# Patient Record
Sex: Male | Born: 1945
Health system: Southern US, Community
[De-identification: ages and names within clinical notes are randomized; demographics above are authoritative.]

## PROBLEM LIST (undated history)

## (undated) DIAGNOSIS — C4490 Unspecified malignant neoplasm of skin, unspecified: Secondary | ICD-10-CM

## (undated) DIAGNOSIS — R7303 Prediabetes: Secondary | ICD-10-CM

## (undated) DIAGNOSIS — R7309 Other abnormal glucose: Secondary | ICD-10-CM

## (undated) DIAGNOSIS — I10 Essential (primary) hypertension: Secondary | ICD-10-CM

## (undated) DIAGNOSIS — K219 Gastro-esophageal reflux disease without esophagitis: Secondary | ICD-10-CM

## (undated) HISTORY — DX: Gilbert syndrome: E80.4

## (undated) HISTORY — PX: TONSILLECTOMY AND ADENOIDECTOMY: SUR1326

## (undated) HISTORY — PX: OTHER SURGICAL HISTORY: SHX169

## (undated) HISTORY — DX: Other abnormal glucose: R73.09

## (undated) HISTORY — PX: TOTAL HIP ARTHROPLASTY: SHX124

## (undated) HISTORY — DX: Gastro-esophageal reflux disease without esophagitis: K21.9

## (undated) HISTORY — DX: Unspecified malignant neoplasm of skin, unspecified: C44.90

---

## 1998-11-28 ENCOUNTER — Encounter: Payer: Self-pay | Admitting: Internal Medicine

## 1998-11-28 ENCOUNTER — Ambulatory Visit (HOSPITAL_COMMUNITY): Admission: RE | Admit: 1998-11-28 | Discharge: 1998-11-28 | Payer: Self-pay | Admitting: Internal Medicine

## 2004-09-18 ENCOUNTER — Ambulatory Visit: Payer: Self-pay | Admitting: Internal Medicine

## 2004-10-30 ENCOUNTER — Ambulatory Visit: Payer: Self-pay | Admitting: Internal Medicine

## 2005-11-18 ENCOUNTER — Ambulatory Visit: Payer: Self-pay | Admitting: Gastroenterology

## 2005-12-09 ENCOUNTER — Encounter (INDEPENDENT_AMBULATORY_CARE_PROVIDER_SITE_OTHER): Payer: Self-pay | Admitting: Specialist

## 2005-12-09 ENCOUNTER — Ambulatory Visit: Payer: Self-pay | Admitting: Gastroenterology

## 2006-01-20 ENCOUNTER — Ambulatory Visit: Payer: Self-pay | Admitting: Internal Medicine

## 2008-10-13 DIAGNOSIS — R7309 Other abnormal glucose: Secondary | ICD-10-CM

## 2008-10-13 HISTORY — DX: Other abnormal glucose: R73.09

## 2008-10-13 HISTORY — DX: Gilbert syndrome: E80.4

## 2008-10-13 HISTORY — PX: HIP RESURFACING: SHX1760

## 2008-11-02 ENCOUNTER — Ambulatory Visit: Payer: Self-pay | Admitting: Internal Medicine

## 2009-01-01 ENCOUNTER — Ambulatory Visit: Payer: Self-pay | Admitting: Internal Medicine

## 2009-01-01 DIAGNOSIS — Z860100 Personal history of colon polyps, unspecified: Secondary | ICD-10-CM | POA: Insufficient documentation

## 2009-01-01 DIAGNOSIS — Z85828 Personal history of other malignant neoplasm of skin: Secondary | ICD-10-CM | POA: Insufficient documentation

## 2009-01-01 DIAGNOSIS — K219 Gastro-esophageal reflux disease without esophagitis: Secondary | ICD-10-CM | POA: Insufficient documentation

## 2009-01-01 DIAGNOSIS — J8410 Pulmonary fibrosis, unspecified: Secondary | ICD-10-CM | POA: Insufficient documentation

## 2009-01-09 ENCOUNTER — Encounter (INDEPENDENT_AMBULATORY_CARE_PROVIDER_SITE_OTHER): Payer: Self-pay | Admitting: *Deleted

## 2009-05-31 ENCOUNTER — Telehealth: Payer: Self-pay | Admitting: Internal Medicine

## 2009-05-31 DIAGNOSIS — Z96649 Presence of unspecified artificial hip joint: Secondary | ICD-10-CM | POA: Insufficient documentation

## 2009-06-07 ENCOUNTER — Encounter (INDEPENDENT_AMBULATORY_CARE_PROVIDER_SITE_OTHER): Payer: Self-pay | Admitting: *Deleted

## 2010-02-05 ENCOUNTER — Encounter: Admission: RE | Admit: 2010-02-05 | Discharge: 2010-02-05 | Payer: Self-pay | Admitting: Orthopedic Surgery

## 2010-02-05 IMAGING — RF DG FLUORO GUIDE NDL PLC/BX
1 series · 1 of 1 positions shown · non-contrast
Comparison: none

Clincial Data: Osteoarthritis with pain

RIGHT HIP INJECTION UNDER FLUOROSCOPY
TECHNIQUE: The overlying skin was prepped with Betadine, draped
in  sterile fashion, and infiltrated locally with 1% Lidocaine.  A
22 gauge spinal needle was advanced to the superolateral margin of
the rightfemoral head.  Diagnostic injection of iodinated contrast
demonstrates intra-articular spread without intravascular
component.
120 mg Depo-Medrol and 3 ml 1% lidocainewere then administered.  No
apparent complication.
Flouroscopy time:17 seconds

[Series 1: run · 1 of 1 slices shown]
[im 1/1]
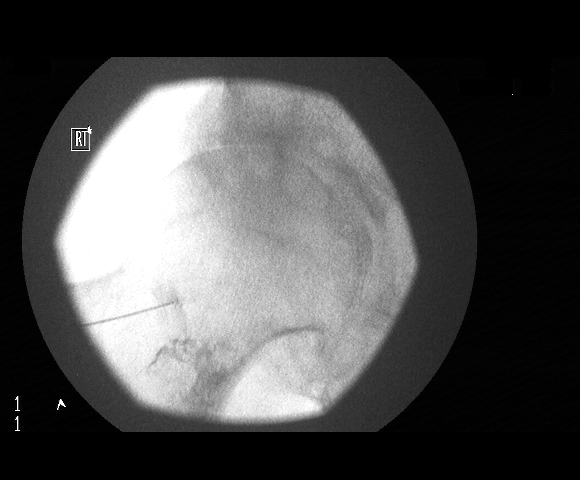

[1 of 1 positions shown; findings below may reference images not displayed]

IMPRESSION: Technically successful right hip injection under fluoroscopy.

## 2010-06-03 ENCOUNTER — Encounter: Admission: RE | Admit: 2010-06-03 | Discharge: 2010-06-03 | Payer: Self-pay | Admitting: Orthopedic Surgery

## 2010-08-23 ENCOUNTER — Ambulatory Visit: Payer: Self-pay | Admitting: Internal Medicine

## 2010-08-23 ENCOUNTER — Encounter: Payer: Self-pay | Admitting: Internal Medicine

## 2010-11-10 LAB — CONVERTED CEMR LAB
ALT: 39 units/L (ref 0–53)
AST: 23 units/L (ref 0–37)
Albumin: 4.3 g/dL (ref 3.5–5.2)
Alkaline Phosphatase: 67 units/L (ref 39–117)
BUN: 17 mg/dL (ref 6–23)
Basophils Absolute: 0 10*3/uL (ref 0.0–0.1)
Basophils Relative: 0.1 % (ref 0.0–3.0)
Bilirubin, Direct: 0.1 mg/dL (ref 0.0–0.3)
CO2: 30 meq/L (ref 19–32)
Calcium: 9.6 mg/dL (ref 8.4–10.5)
Chloride: 105 meq/L (ref 96–112)
Cholesterol: 180 mg/dL (ref 0–200)
Creatinine, Ser: 1.1 mg/dL (ref 0.4–1.5)
Eosinophils Absolute: 0.1 10*3/uL (ref 0.0–0.7)
Eosinophils Relative: 0.8 % (ref 0.0–5.0)
GFR calc non Af Amer: 72.4 mL/min (ref >60)
Glucose, Bld: 110 mg/dL — ABNORMAL HIGH (ref 70–99)
HCT: 47.5 % (ref 39.0–52.0)
HDL: 43.4 mg/dL (ref >39.00)
Hemoglobin: 16.4 g/dL (ref 13.0–17.0)
LDL Cholesterol: 115 mg/dL — ABNORMAL HIGH (ref 0–99)
Lymphocytes Relative: 17.5 % (ref 12.0–46.0)
Lymphs Abs: 1.6 10*3/uL (ref 0.7–4.0)
MCHC: 34.4 g/dL (ref 30.0–36.0)
MCV: 89.1 fL (ref 78.0–100.0)
Monocytes Absolute: 0.7 10*3/uL (ref 0.1–1.0)
Monocytes Relative: 7.5 % (ref 3.0–12.0)
Neutro Abs: 6.6 10*3/uL (ref 1.4–7.7)
Neutrophils Relative %: 74.1 % (ref 43.0–77.0)
PSA: 0.72 ng/mL (ref 0.10–4.00)
Platelets: 259 10*3/uL (ref 150.0–400.0)
Potassium: 4.4 meq/L (ref 3.5–5.1)
RBC: 5.33 M/uL (ref 4.22–5.81)
RDW: 12.4 % (ref 11.5–14.6)
Sodium: 141 meq/L (ref 135–145)
TSH: 1.06 microintl units/mL (ref 0.35–5.50)
Total Bilirubin: 1.3 mg/dL — ABNORMAL HIGH (ref 0.3–1.2)
Total CHOL/HDL Ratio: 4
Total Protein: 7 g/dL (ref 6.0–8.3)
Triglycerides: 106 mg/dL (ref 0.0–149.0)
VLDL: 21.2 mg/dL (ref 0.0–40.0)
WBC: 9 10*3/uL (ref 4.5–10.5)

## 2010-11-12 NOTE — Letter (Signed)
Summary: Surgical Clearance/[facility]  Surgical Clearance/[facility]   Imported By: Lanelle Bal 09/06/2010 10:14:52  _____________________________________________________________________  External Attachment:    Type:   Image     Comment:   External Document

## 2010-11-12 NOTE — Assessment & Plan Note (Signed)
Summary: MEDICAL CLEARANCE,REF BY DR [facility]THO/RH....   Vital Signs:  Patient profile:   65 year old male Height:      79 inches Weight:      253 pounds BMI:     28.60 Temp:     97.7 degrees F oral Pulse rate:   60 / minute Resp:     14 per minute BP sitting:   122 / 78  (left arm) Cuff size:   large  Vitals Entered By: Shonna Chock CMA (August 23, 2010 11:28 AM)  CC:  Lower Extremity Joint pain and Pre-op Evaluation.  History of Present Illness:         Mr. Rister is to have  R hip" resurfacing" on 09/10/2010 by Dr Thamas Jaegers  @ [facility].  The patient reports giving away, popping, decreased ROM, and  RLE weakness, but denies swelling, redness, locking, and stiffness for >1 hr.  The pain began gradually w/o single tigger or injury.  The pain is described as dull, intermittent, and occuring  mainly at rest. Previously the pain required he quit  playing tennis. Now he is limping & his R foot is turning in.The patient denies the following symptoms: fever, rash, photosensitivity, eye symptoms, diarrhea, and dysuria.  Meloxicam helps pain.        The patient denies respiratory symptoms, GI bleeding, chest pain, edema, and PND @ this time.    Allergies (verified): No Known Drug Allergies  Past History:  Past Medical History: Colonic polyps, PMH of 2007,Hyperplastic , Dr Christella Hartigan GERD Skin cancer,PMH  of X 3 , basal cell, Dr Danella Deis 10X12 mm calcified granuloma RUL ,Remote Granulomatous Disease, 2000 ([place])  Past Surgical History: Fractured jaw ;  Mohs for basal cell cancer  X2 Tonsillectomy Vasectomy  Family History: Father: cns tumor ,d @ age 31 Mother: DM Siblings: bro: d 76 lung  cancer   Social History: Occupation: Charity fundraiser Married Former Smoker, occasional  cigar until 1999 Alcohol use-yes,socially Regular exercise-no   Review of Systems General:  Denies chills, fatigue, fever, sweats, and weight loss. Eyes:  Denies  blurring, double vision, and vision loss-both eyes. ENT:  Denies difficulty swallowing and hoarseness. CV:  Denies chest pain or discomfort, leg cramps with exertion, and palpitations. Resp:  Denies chest pain with inspiration, shortness of breath, sputum productive, and wheezing. GI:  Denies bloody stools, dark tarry stools, and indigestion; No dysphagia; as needed Zantac controls symptoms. GU:  Denies discharge, dysuria, and hematuria. Derm:  Denies changes in nail beds and dryness. Neuro:  Denies numbness and tingling. Endo:  Denies cold intolerance, excessive hunger, excessive thirst, excessive urination, and heat intolerance. Heme:  Denies abnormal bruising and bleeding. Allergy:  Denies itching eyes and sneezing.  Physical Exam  General:  well-nourished;alert,appropriate and cooperative throughout examination Head:  Normocephalic and atraumatic without obvious abnormalities. Pattern  alopecia ; goatee Neck:  No deformities, masses, or tenderness noted. Chest Wall:   Mild pectus excavatum & thoracic asymmetry ,L > R.   Lungs:  Normal respiratory effort, chest expands symmetrically. Lungs are clear to auscultation, no crackles or wheezes. Heart:  Normal rate and regular rhythm. S1 and S2 normal without gallop, murmur, click, rub .S4 with slurring @ apex Abdomen:  Bowel sounds positive,abdomen soft and non-tender without masses, organomegaly or hernias noted. Msk:  No deformity or scoliosis noted of thoracic or lumbar spine.   Pulses:  R and L carotid,radial,dorsalis pedis and posterior tibial pulses are full  and equal bilaterally Extremities:  No clubbing, cyanosis, edema, or deformity noted with normal full range of motion of all joints. Crepitus R knee   Neurologic:  alert & oriented X3, strength normal in all extremities, and DTRs symmetrical and normal.   Skin:  Intact without suspicious lesions or rashes Cervical Nodes:  No lymphadenopathy noted Psych:  memory intact for recent  and remote, normally interactive, and good eye contact.     Impression & Recommendations:  Problem # 1:  ARTHRITIS, HIP (ICD-716.95)  with pain & gait  issues; no medical contraindications to surgery  Problem # 2:  GERD (ICD-530.81)  stable   Orders: EKG w/ Interpretation (93000)  Problem # 3:  PREOPERATIVE EXAMINATION (ICD-V72.84)  Orders: EKG w/ Interpretation (93000)  Complete Medication List: 1)  Claritin-d 12 Hour 5-120 Mg Xr12h-tab (Loratadine-pseudoephedrine) .... As needed 2)  Meloxicam 7.5 Mg Tabs (Meloxicam)  Other Orders: Tdap => 97yrs IM (16109) Admin 1st Vaccine (60454)  Patient Instructions: 1)  Take record to Anesthesia pre op   appt & to Dr Thamas Jaegers. You are cleared for surgery.   Orders Added: 1)  Tdap => 71yrs IM [90715] 2)  Admin 1st Vaccine [90471] 3)  Est. Patient Level IV [09811] 4)  EKG w/ Interpretation [93000]   Immunizations Administered:  Tetanus Vaccine:    Vaccine Type: Tdap    Site: right deltoid    Mfr: GlaxoSmithKline    Dose: 0.5 ml    Route: IM    Given by: Shonna Chock CMA    Exp. Date: 08/01/2012    Lot #: [id number]    VIS given: 08/30/08 version given August 23, 2010.   Immunizations Administered:  Tetanus Vaccine:    Vaccine Type: Tdap    Site: right deltoid    Mfr: GlaxoSmithKline    Dose: 0.5 ml    Route: IM    Given by: Shonna Chock CMA    Exp. Date: 08/01/2012    Lot #: [id number]    VIS given: 08/30/08 version given August 23, 2010.

## 2010-12-10 ENCOUNTER — Telehealth: Payer: Self-pay | Admitting: Internal Medicine

## 2010-12-19 NOTE — Progress Notes (Signed)
Summary: Viagra refill  Phone Note Refill Request Call back at [phone] Message from:  Patient on December 10, 2010 9:33 AM  Refills Requested: Medication #1:  VIAGRA   Notes: Patient says he hasnt had this refilled since 2006 [facility], [place], [place]  Next Appointment Scheduled: none Initial call taken by: Jerolyn Shin,  December 10, 2010 9:34 AM  Follow-up for Phone Call        Dr.Ralyn Stlaurent please advise, med not active on med list Follow-up by: Shonna Chock CMA,  December 10, 2010 2:10 PM  Additional Follow-up for Phone Call Additional follow up Details #1::        Viagra 100 mg # 6 , 1/2-1 once daily as needed , RX5 Additional Follow-up by: Marga Melnick MD,  December 10, 2010 3:04 PM    Additional Follow-up for Phone Call Additional follow up Details #2::    Patient notified. Follow-up by: Lucious Groves CMA,  December 10, 2010 3:26 PM  New/Updated Medications: VIAGRA 100 MG TABS (SILDENAFIL CITRATE) 0.5-1 by mouth daily as needed Prescriptions: VIAGRA 100 MG TABS (SILDENAFIL CITRATE) 0.5-1 by mouth daily as needed  #6 x 5   Entered by:   Lucious Groves CMA   Authorized by:   Marga Melnick MD   Signed by:   Lucious Groves CMA on 12/10/2010   Method used:   Electronically to        [facility]* (retail)       [address] [place], [place]  [address]       Ph: [phone]       Fax: [phone]   RxID:   [id number]

## 2011-09-30 ENCOUNTER — Ambulatory Visit (INDEPENDENT_AMBULATORY_CARE_PROVIDER_SITE_OTHER): Payer: BC Managed Care – PPO

## 2011-09-30 DIAGNOSIS — Z23 Encounter for immunization: Secondary | ICD-10-CM

## 2011-10-21 ENCOUNTER — Telehealth: Payer: Self-pay | Admitting: Family

## 2011-10-21 ENCOUNTER — Encounter: Payer: Self-pay | Admitting: Family

## 2011-10-21 ENCOUNTER — Ambulatory Visit (INDEPENDENT_AMBULATORY_CARE_PROVIDER_SITE_OTHER): Payer: BC Managed Care – PPO | Admitting: Family

## 2011-10-21 DIAGNOSIS — J329 Chronic sinusitis, unspecified: Secondary | ICD-10-CM

## 2011-10-21 MED ORDER — AMOXICILLIN-POT CLAVULANATE 875-125 MG PO TABS: 1.0000 | ORAL_TABLET | Freq: Two times a day (BID) | ORAL | Status: DC

## 2011-10-21 MED ORDER — LEVOFLOXACIN 500 MG PO TABS: 500.0000 mg | ORAL_TABLET | Freq: Every day | ORAL | Status: AC

## 2011-10-21 NOTE — Progress Notes (Signed)
  Subjective:    Patient ID: Maxwell Hinton, male    DOB: [birth year], 66 y.o.   MRN: [id number]  HPI  Maxwell Hinton is a 66 yr old male who presents today with chief complaint of sinus congestion/pressure. Sinus pressure is noted in the frontal sinus.  Nasal discharge yellow/green.  Energy is fair.  Started 2 weeks ago.     Review of Systems See HPI  No past medical history on file.  History   Social History  . Marital Status: Single    Spouse Name: N/A    Number of Children: N/A  . Years of Education: N/A   Occupational History  . Not on file.   Social History Main Topics  . Smoking status: Never Smoker   . Smokeless tobacco: Never Used  . Alcohol Use: Not on file  . Drug Use: Not on file  . Sexually Active: Not on file   Other Topics Concern  . Not on file   Social History Narrative  . No narrative on file    No past surgical history on file.  No family history on file.  No Known Allergies  No current outpatient prescriptions on file prior to visit.    BP 136/82  Pulse 84  Temp(Src) 98.2 F (36.8 C) (Oral)  Resp 16  Wt 268 lb (121.564 kg)       Objective:   Physical Exam  Constitutional: He is oriented to person, place, and time. He appears well-developed and well-nourished. No distress.  HENT:  Head: Normocephalic and atraumatic.  Mouth/Throat: No posterior oropharyngeal edema or posterior oropharyngeal erythema.       Mild frontal/maxillary sinus tenderness to palpation.   Cardiovascular: Normal rate and regular rhythm.   No murmur heard. Pulmonary/Chest: Effort normal and breath sounds normal. No respiratory distress. He has no wheezes. He has no rales. He exhibits no tenderness.  Musculoskeletal: He exhibits no edema.  Neurological: He is alert and oriented to person, place, and time.  Skin: Skin is warm and dry. No erythema. No pallor.  Psychiatric: He has a normal mood and affect. His behavior is normal. Judgment and thought content  normal.          Assessment & Plan:

## 2011-10-21 NOTE — Telephone Encounter (Signed)
Pharmacy called, they are out of augmentin 875 and pt wants to fill meds at [facility]. Verbal order given for levaquin.

## 2011-10-21 NOTE — Patient Instructions (Signed)

## 2011-10-21 NOTE — Assessment & Plan Note (Signed)
Will plan to treat with augmentin.

## 2012-10-21 ENCOUNTER — Ambulatory Visit (INDEPENDENT_AMBULATORY_CARE_PROVIDER_SITE_OTHER): Payer: BC Managed Care – PPO

## 2012-10-21 DIAGNOSIS — Z23 Encounter for immunization: Secondary | ICD-10-CM

## 2012-12-22 ENCOUNTER — Encounter: Payer: Self-pay | Admitting: Internal Medicine

## 2012-12-22 ENCOUNTER — Ambulatory Visit (INDEPENDENT_AMBULATORY_CARE_PROVIDER_SITE_OTHER): Payer: BC Managed Care – PPO | Admitting: Internal Medicine

## 2012-12-22 VITALS — BP 118/80 | HR 86 | Temp 95.4°F | Wt 266.0 lb

## 2012-12-22 DIAGNOSIS — M1610 Unilateral primary osteoarthritis, unspecified hip: Secondary | ICD-10-CM

## 2012-12-22 DIAGNOSIS — M545 Low back pain, unspecified: Secondary | ICD-10-CM

## 2012-12-22 MED ORDER — AMOXICILLIN 500 MG PO CAPS: ORAL_CAPSULE | ORAL | Status: DC

## 2012-12-22 MED ORDER — TRAMADOL HCL 50 MG PO TABS: 50.0000 mg | ORAL_TABLET | Freq: Four times a day (QID) | ORAL | Status: DC | PRN

## 2012-12-22 MED ORDER — CYCLOBENZAPRINE HCL 5 MG PO TABS: ORAL_TABLET | ORAL | Status: DC

## 2012-12-22 NOTE — Patient Instructions (Addendum)
Use an anti-inflammatory cream such as Aspercreme or Zostrix cream twice a day to the back as needed. In lieu of this warm moist compresses or  hot water bottle can be used. Do not apply ice .The best exercises for the low back include freestyle swimming, stretch aerobics, and yoga.

## 2012-12-22 NOTE — Progress Notes (Signed)
  Subjective:    Patient ID: Maxwell Hinton, male    DOB: 1946-07-24, 67 y.o.   MRN: [id number]  HPI Back pain began in the R lumbosacral spine area in the context of getting out of bed .No repetitive motion, injury, lifting, overuse or hyperextension triggers The pain is described as dull- sharp and non  radiating The pain is worse sitting& with R lateral rotation. Pain has improved with nonsteroidals . No associated  numbness and tingling or limb weakness.    Review of Systems Constitutional: No fever, chills, sweats, unexplained weight loss HEENT: No diplopia, blurred vision, or loss of vision. No hearing loss or tinnitus Cardiopulmonary: No chest pain; palpitations; dyspnea; edema;cough ; sputum production ;hemoptysis GI: No abdominal pain; unexplained weight loss; melena; rectal bleeding GU: No hematuria, pyuria, or dysuria MS: No joint stiffness;redness Heme/Lymph:No abnormal bruising or bleeding     Objective:   Physical Exam Gen.: well-nourished in appearance. Alert, appropriate and cooperative throughout exam.  Abdomen: Bowel sounds normal; abdomen soft and nontender. No masses, organomegaly or hernias noted.  No AAA.                                Musculoskeletal/extremities: There is some asymmetry of the posterior thoracic musculature suggesting occult scoliosis.No clubbing, cyanosis, edema, or significant extremity  deformity noted. Range of motion normal .Tone & strength  normal.Joints normal . Nail health good. Able to lie down & sit up w/o help. Negative SLR bilaterally Vascular:  dorsalis pedis and  posterior tibial pulses are full and equal. No bruits present. Neurologic: Alert and oriented x3. Deep tendon reflexes symmetrical and normal.Gait including heel & toe walking normal.        Skin: Intact without suspicious lesions or rashes. Psych: Mood and affect are normal. Normally interactive         Assessment & Plan:  #1 acute low back syndrome with probable  occult scoliosis.  Plan: See orders and recommendations

## 2013-02-28 ENCOUNTER — Encounter: Payer: Self-pay | Admitting: Lab

## 2013-03-01 ENCOUNTER — Encounter: Payer: Self-pay | Admitting: Internal Medicine

## 2013-03-01 ENCOUNTER — Ambulatory Visit (INDEPENDENT_AMBULATORY_CARE_PROVIDER_SITE_OTHER): Payer: BC Managed Care – PPO | Admitting: Internal Medicine

## 2013-03-01 VITALS — BP 128/84 | HR 72 | Temp 97.7°F | Resp 12 | Ht 79.0 in | Wt 263.0 lb

## 2013-03-01 DIAGNOSIS — Z23 Encounter for immunization: Secondary | ICD-10-CM

## 2013-03-01 DIAGNOSIS — Z2911 Encounter for prophylactic immunotherapy for respiratory syncytial virus (RSV): Secondary | ICD-10-CM

## 2013-03-01 DIAGNOSIS — Z0000 Encounter for general adult medical examination without abnormal findings: Secondary | ICD-10-CM

## 2013-03-01 LAB — CBC WITH DIFFERENTIAL/PLATELET
Basophils Relative: 0.9 % (ref 0.0–3.0)
Eosinophils Relative: 0.9 % (ref 0.0–5.0)
HCT: 48.5 % (ref 39.0–52.0)
Lymphs Abs: 1.7 10*3/uL (ref 0.7–4.0)
MCV: 88.6 fl (ref 78.0–100.0)
Monocytes Absolute: 0.4 10*3/uL (ref 0.1–1.0)
Neutro Abs: 3.2 10*3/uL (ref 1.4–7.7)
RBC: 5.48 Mil/uL (ref 4.22–5.81)
WBC: 5.4 10*3/uL (ref 4.5–10.5)

## 2013-03-01 LAB — BASIC METABOLIC PANEL
BUN: 16 mg/dL (ref 6–23)
Chloride: 104 mEq/L (ref 96–112)
Potassium: 4.3 mEq/L (ref 3.5–5.1)

## 2013-03-01 LAB — HEPATIC FUNCTION PANEL
ALT: 27 U/L (ref 0–53)
Total Protein: 7.4 g/dL (ref 6.0–8.3)

## 2013-03-01 LAB — LIPID PANEL
Cholesterol: 185 mg/dL (ref 0–200)
Triglycerides: 82 mg/dL (ref 0.0–149.0)

## 2013-03-01 NOTE — Patient Instructions (Addendum)
Preventive Health Care: Exercise at least 30-45 minutes a day,  3-4 days a week. The best exercises for the low back include freestyle swimming, stretch aerobics, and yoga. Eat a low-fat diet with lots of fruits and vegetables, up to 7-9 servings per day. This would eliminate the need for vitamin supplements. Consume less than 40 grams of sugar (preferably ZERO) per day from foods & drinks with High Fructose Corn Sugar as #1,2,3 or # 4 on label. If you activate the  My Chart system; lab & Xray results will be released directly  to you as soon as I review & address these through the computer. If you choose not to sign up for My Chart within 36 hours of labs being drawn; results will be reviewed & interpretation added before being copied & mailed, causing a delay in getting the results to you.If you do not receive that report within 7-10 days ,please call. Additionally you can use this system to gain direct  access to your records  if  out of town or @ an office of a  physician who is not in  the My Chart network.  This improves continuity of care & places you in control of your medical record.

## 2013-03-01 NOTE — Progress Notes (Signed)
  Subjective:    Patient ID: Maxwell Hinton, male    DOB: 26-Jun-1946, 66 y.o.   MRN: [id number]  HPI Maxwell Hinton  is here for a physical;he denies acute issues.     Review of Systems He is on a modified heart healthy diet; he exercises minimally. He denies chest pain, palpitations, dyspnea, or claudication. Family history is negative for premature coronary disease. Cholesterol testing revealed his LDL goal was 115 in 2010.  Also in 2010 he had mild fasting hyperglycemia. His mother developed diabetes late in life. He has no excessive thirst, hunger, or urination. He has no unexplained weight loss. He has some mild paresthesias over the lateral aspect of the left foot.  His x-ray in 2000 revealed incidental granulomatous lung disease. He is asymptomatic without cough or sputum production     Objective:   Physical Exam Gen.: Healthy and well-nourished in appearance. Alert, appropriate and cooperative throughout exam. Appears younger than stated age  Head: Normocephalic without obvious abnormalities; goatee & pattern alopecia  Eyes: No corneal or conjunctival inflammation noted. Pupils equal round reactive to light and accommodation.  Extraocular motion intact. Vision grossly normal without lenses Ears: External  ear exam reveals no significant lesions or deformities. Canals clear .TMs normal. Hearing is grossly normal bilaterally. Nose: External nasal exam reveals no deformity or inflammation. Nasal mucosa are pink and moist. No lesions or exudates noted. Septum slightly dislocated Mouth: Oral mucosa and oropharynx reveal no lesions or exudates. Teeth in good repair. Neck: No deformities, masses, or tenderness noted. Range of motion & Thyroid normal. Lungs: Normal respiratory effort; chest expands symmetrically. Lungs are clear to auscultation without rales, wheezes, or increased work of breathing. Heart: Normal rate and rhythm. Normal S1 and S2. No gallop, click, or rub. S4 w/o murmur. Abdomen:  Bowel sounds normal; abdomen soft and nontender. No masses, organomegaly or hernias noted. Genitalia: Genitalia normal except for left varices. Prostate is normal without enlargement, asymmetry, nodularity, or induration.                                    Musculoskeletal/extremities:  There is some asymmetry of the anterior thorax suggesting occult scoliosis. No clubbing, cyanosis, edema, or significant extremity  deformity noted. Range of motion decreased R hip . Crepitus R knee.Tone & strength  Normal. Joints reveal minor  DJD DIP changes. Nail health good. Able to lie down & sit up w/o help. Negative SLR bilaterally Vascular: Carotid, radial artery, dorsalis pedis and  posterior tibial pulses are full and equal. No bruits present. Neurologic: Alert and oriented x3. Deep tendon reflexes symmetrical and normal.         Skin: Intact without suspicious lesions or rashes.Solar changes Lymph: No cervical, axillary, or inguinal lymphadenopathy present. Psych: Mood and affect are normal. Normally interactive                                                                                       Assessment & Plan:  #1 comprehensive physical exam; no acute findings  Plan: see Orders  & Recommendations

## 2013-03-14 ENCOUNTER — Encounter: Payer: Self-pay | Admitting: Internal Medicine

## 2013-08-18 ENCOUNTER — Other Ambulatory Visit: Payer: Self-pay

## 2013-09-14 ENCOUNTER — Encounter: Payer: Self-pay | Admitting: Internal Medicine

## 2013-09-14 ENCOUNTER — Ambulatory Visit (INDEPENDENT_AMBULATORY_CARE_PROVIDER_SITE_OTHER): Payer: Medicare Other | Admitting: Internal Medicine

## 2013-09-14 VITALS — BP 134/84 | HR 65 | Temp 97.5°F | Wt 266.0 lb

## 2013-09-14 DIAGNOSIS — M549 Dorsalgia, unspecified: Secondary | ICD-10-CM

## 2013-09-14 DIAGNOSIS — M545 Low back pain, unspecified: Secondary | ICD-10-CM

## 2013-09-14 LAB — URINALYSIS, ROUTINE W REFLEX MICROSCOPIC
Bilirubin Urine: NEGATIVE
Hgb urine dipstick: NEGATIVE
Ketones, ur: NEGATIVE
Leukocytes, UA: NEGATIVE
Nitrite: NEGATIVE

## 2013-09-14 MED ORDER — CYCLOBENZAPRINE HCL 10 MG PO TABS: 10.0000 mg | ORAL_TABLET | Freq: Every evening | ORAL | Status: DC | PRN

## 2013-09-14 NOTE — Patient Instructions (Signed)
Go to the lab before you leave  For pain take : Motrin 200 mg 2 tablets every 6 hours as needed for pain. Always take it with food because may cause gastritis and ulcers. If you notice nausea, stomach pain, change in the color of stools --->  Stop the medicine and let us know Or  Tylenol  500 mg OTC 2 tabs a day every 8 hours as needed for pain  Flexeril at night Call if no better in few days, call if symptoms severe  [facility] Website with information about home physical therapy for back pain: [email or web address]

## 2013-09-14 NOTE — Progress Notes (Signed)
Pre visit review using our clinic review tool, if applicable. No additional management support is needed unless otherwise documented below in the visit note. 

## 2013-09-14 NOTE — Progress Notes (Signed)
   Subjective:    Patient ID: Maxwell Hinton, male    DOB: 22-Jun-1946, 67 y.o.   MRN: [id number]  HPI Acute visit Developed a dull/sharp pain located at the right flank last week, at rest pain is minimal, pain increased with certain positions or by  bending his torso.  Past Medical History  Diagnosis Date  . Hyperlipidemia 2010     LDL 115  . Other abnormal glucose 2010  . Gilbert's syndrome 2010   Past Surgical History  Procedure Laterality Date  . Total hip arthroplasty    . Tonsillectomy and adenoidectomy    . Colonoscopy with polypectomy      X2 ; hyperplastic   History   Social History  . Marital Status: Single    Spouse Name: N/A    Number of Children: 2  . Years of Education: N/A   Occupational History  . Not on file.   Social History Main Topics  . Smoking status: Never Smoker   . Smokeless tobacco: Never Used  . Alcohol Use: 8.4 oz/week    14 Glasses of wine per week  . Drug Use: No  . Sexual Activity: Not on file   Other Topics Concern  . Not on file   Social History Narrative  . No narrative on file    Review of Systems No fever or chills No abdominal pain. No chest pain or shortness or breath No dysuria, gross hematuria or difficulty urinating. No recent injury,  or heavy lifting     Objective:   Physical Exam BP 134/84  Pulse 65  Temp(Src) 97.5 F (36.4 C)  Wt 266 lb (120.657 kg)  SpO2 100% General -- alert, well-developed, NAD.   Abdomen-- Not distended, good bowel sounds,soft, non-tender. No rebound or rigidity. No mass,organomegaly. Back-- no TTP Extremities-- no pretibial edema bilaterally  Neurologic--  alert & oriented X3. Speech normal, gait normal, strength normal in all extremities.  DTRs symmetric , Straight leg test negative Psych-- Cognition and judgment appear intact. Cooperative with normal attention span and concentration. No anxious appearing , no depressed appearing.      Assessment & Plan:  Back pain, Acute  back pain, likely a muscle skeletal issue, no red flag symptoms. Plan: To be complete will check a UA otherwise treat as a muscle skeletal issue, encourage stretching, see instructions

## 2014-07-13 ENCOUNTER — Encounter: Payer: Self-pay | Admitting: Internal Medicine

## 2014-07-13 ENCOUNTER — Ambulatory Visit (INDEPENDENT_AMBULATORY_CARE_PROVIDER_SITE_OTHER): Payer: Medicare Other | Admitting: Internal Medicine

## 2014-07-13 VITALS — BP 140/80 | HR 71 | Temp 97.8°F | Ht 79.0 in | Wt 274.2 lb

## 2014-07-13 DIAGNOSIS — J019 Acute sinusitis, unspecified: Secondary | ICD-10-CM | POA: Insufficient documentation

## 2014-07-13 DIAGNOSIS — J018 Other acute sinusitis: Secondary | ICD-10-CM

## 2014-07-13 DIAGNOSIS — R0981 Nasal congestion: Secondary | ICD-10-CM

## 2014-07-13 MED ORDER — AZITHROMYCIN 250 MG PO TABS: ORAL_TABLET | ORAL | Status: DC

## 2014-07-13 MED ORDER — ASPIRIN EC 81 MG PO TBEC: 81.0000 mg | DELAYED_RELEASE_TABLET | Freq: Every day | ORAL | Status: DC

## 2014-07-13 NOTE — Patient Instructions (Addendum)
Please take all new medication as prescribed - the antibiotic  Please also take OTC Zyrtec as this may help with ? Allergies  You can also take Delsym OTC for cough, and/or Mucinex (or it's generic off brand) for congestion, and tylenol as needed for pain.  Also, please start Aspirin 81 mg per day - to help reduce risk of stroke and heart disease  Please continue all other medications as before, and refills have been done if requested.  Please have the pharmacy call with any other refills you may need.  Please keep your appointments with your specialists as you may have planned

## 2014-07-13 NOTE — Progress Notes (Signed)
Pre visit review using our clinic review tool, if applicable. No additional management support is needed unless otherwise documented below in the visit note. 

## 2014-07-13 NOTE — Progress Notes (Signed)
   Subjective:    Patient ID: Maxwell Hinton, male    DOB: 11/06/1945, 68 y.o.   MRN: [id number]  HPI  Here with 2-3 days acute onset fever, facial pain, pressure, headache, general weakness and malaise, and greenish d/c, with mild ST and cough, but pt denies chest pain, wheezing, increased sob or doe, orthopnea, PND, increased LE swelling, palpitations, dizziness or syncope. Past Medical History  Diagnosis Date  . Hyperlipidemia 2010     LDL 115  . Other abnormal glucose 2010  . Gilbert's syndrome 2010   Past Surgical History  Procedure Laterality Date  . Total hip arthroplasty    . Tonsillectomy and adenoidectomy    . Colonoscopy with polypectomy      X2 ; hyperplastic    reports that he has never smoked. He has never used smokeless tobacco. He reports that he drinks about 8.4 ounces of alcohol per week. He reports that he does not use illicit drugs. family history includes Diabetes in his mother; Lung cancer in his brother. There is no history of Heart disease or Stroke. No Known Allergies Current Outpatient Prescriptions on File Prior to Visit  Medication Sig Dispense Refill  . cyclobenzaprine (FLEXERIL) 10 MG tablet Take 1 tablet (10 mg total) by mouth at bedtime as needed for muscle spasms.  21 tablet  0   No current facility-administered medications on file prior to visit.   Review of Systems All otherwise neg per pt     Objective:   Physical Exam BP 140/80  Pulse 71  Temp(Src) 97.8 F (36.6 C) (Oral)  Ht 6\' 7"  (2.007 m)  Wt 274 lb 4 oz (124.399 kg)  BMI 30.88 kg/m2  SpO2 96% VS noted,  Constitutional: Pt appears well-developed, well-nourished.  HENT: Head: NCAT.  Right Ear: External ear normal.  Left Ear: External ear normal.  Eyes: . Pupils are equal, round, and reactive to light. Conjunctivae and EOM are normal Neck: Normal range of motion. Neck supple.  Bilat tm's with mild erythema.  Max sinus areas mild tender.  Pharynx with mild erythema, no  exudate Cardiovascular: Normal rate and regular rhythm.   Pulmonary/Chest: Effort normal and breath sounds normal.  Neurological: Pt is alert. Not confused , motor grossly intact Skin: Skin is warm. No rash Psychiatric: Pt behavior is normal. No agitation.     Assessment & Plan:

## 2014-07-16 NOTE — Assessment & Plan Note (Signed)
Mild to mod, for antibx course,  to f/u any worsening symptoms or concerns 

## 2014-11-07 ENCOUNTER — Ambulatory Visit (INDEPENDENT_AMBULATORY_CARE_PROVIDER_SITE_OTHER): Payer: Medicare Other

## 2014-11-07 DIAGNOSIS — Z23 Encounter for immunization: Secondary | ICD-10-CM

## 2015-03-28 ENCOUNTER — Encounter: Payer: Self-pay | Admitting: Internal Medicine

## 2015-08-22 ENCOUNTER — Ambulatory Visit (INDEPENDENT_AMBULATORY_CARE_PROVIDER_SITE_OTHER): Payer: Medicare Other | Admitting: Internal Medicine

## 2015-08-22 ENCOUNTER — Other Ambulatory Visit (INDEPENDENT_AMBULATORY_CARE_PROVIDER_SITE_OTHER): Payer: Medicare Other

## 2015-08-22 ENCOUNTER — Encounter: Payer: Self-pay | Admitting: Internal Medicine

## 2015-08-22 VITALS — BP 128/84 | HR 84 | Temp 97.6°F | Ht 79.0 in | Wt 271.0 lb

## 2015-08-22 DIAGNOSIS — R21 Rash and other nonspecific skin eruption: Secondary | ICD-10-CM | POA: Insufficient documentation

## 2015-08-22 DIAGNOSIS — Z0001 Encounter for general adult medical examination with abnormal findings: Secondary | ICD-10-CM | POA: Insufficient documentation

## 2015-08-22 DIAGNOSIS — Z86010 Personal history of colonic polyps: Secondary | ICD-10-CM

## 2015-08-22 DIAGNOSIS — B955 Unspecified streptococcus as the cause of diseases classified elsewhere: Secondary | ICD-10-CM

## 2015-08-22 DIAGNOSIS — Z23 Encounter for immunization: Secondary | ICD-10-CM

## 2015-08-22 DIAGNOSIS — Z0000 Encounter for general adult medical examination without abnormal findings: Secondary | ICD-10-CM | POA: Diagnosis not present

## 2015-08-22 DIAGNOSIS — Z0189 Encounter for other specified special examinations: Secondary | ICD-10-CM | POA: Diagnosis not present

## 2015-08-22 DIAGNOSIS — A483 Toxic shock syndrome: Secondary | ICD-10-CM

## 2015-08-22 LAB — CBC WITH DIFFERENTIAL/PLATELET
BASOS ABS: 0 10*3/uL (ref 0.0–0.1)
Basophils Relative: 0.4 % (ref 0.0–3.0)
Eosinophils Absolute: 0.1 10*3/uL (ref 0.0–0.7)
Eosinophils Relative: 1.3 % (ref 0.0–5.0)
HCT: 45.1 % (ref 39.0–52.0)
Hemoglobin: 15.1 g/dL (ref 13.0–17.0)
LYMPHS ABS: 1.8 10*3/uL (ref 0.7–4.0)
Lymphocytes Relative: 27 % (ref 12.0–46.0)
MCHC: 33.5 g/dL (ref 30.0–36.0)
MCV: 88.8 fl (ref 78.0–100.0)
MONO ABS: 0.5 10*3/uL (ref 0.1–1.0)
MONOS PCT: 7.6 % (ref 3.0–12.0)
NEUTROS ABS: 4.1 10*3/uL (ref 1.4–7.7)
NEUTROS PCT: 63.7 % (ref 43.0–77.0)
PLATELETS: 296 10*3/uL (ref 150.0–400.0)
RBC: 5.08 Mil/uL (ref 4.22–5.81)
RDW: 13.1 % (ref 11.5–15.5)
WBC: 6.5 10*3/uL (ref 4.0–10.5)

## 2015-08-22 LAB — URINALYSIS, ROUTINE W REFLEX MICROSCOPIC
Bilirubin Urine: NEGATIVE
Hgb urine dipstick: NEGATIVE
Ketones, ur: NEGATIVE
Leukocytes, UA: NEGATIVE
Nitrite: NEGATIVE
PH: 7 (ref 5.0–8.0)
RBC / HPF: NONE SEEN (ref 0–?)
Specific Gravity, Urine: 1.02 (ref 1.000–1.030)
TOTAL PROTEIN, URINE-UPE24: NEGATIVE
URINE GLUCOSE: NEGATIVE
UROBILINOGEN UA: 1 (ref 0.0–1.0)
WBC, UA: NONE SEEN (ref 0–?)

## 2015-08-22 LAB — PSA: PSA: 0.87 ng/mL (ref 0.10–4.00)

## 2015-08-22 LAB — HEPATIC FUNCTION PANEL
ALBUMIN: 4.4 g/dL (ref 3.5–5.2)
ALK PHOS: 51 U/L (ref 39–117)
ALT: 21 U/L (ref 0–53)
AST: 16 U/L (ref 0–37)
BILIRUBIN DIRECT: 0.1 mg/dL (ref 0.0–0.3)
TOTAL PROTEIN: 6.4 g/dL (ref 6.0–8.3)
Total Bilirubin: 0.8 mg/dL (ref 0.2–1.2)

## 2015-08-22 LAB — BASIC METABOLIC PANEL
BUN: 15 mg/dL (ref 6–23)
CALCIUM: 9.6 mg/dL (ref 8.4–10.5)
CHLORIDE: 104 meq/L (ref 96–112)
CO2: 29 meq/L (ref 19–32)
Creatinine, Ser: 1.01 mg/dL (ref 0.40–1.50)
GFR: 78.23 mL/min (ref >60.00)
GLUCOSE: 121 mg/dL — AB (ref 70–99)
POTASSIUM: 4.2 meq/L (ref 3.5–5.1)
SODIUM: 141 meq/L (ref 135–145)

## 2015-08-22 LAB — TSH: TSH: 1.42 u[IU]/mL (ref 0.35–4.50)

## 2015-08-22 NOTE — Patient Instructions (Addendum)
You had the flu shot today  Please return for Nurse Visit in 2 wks for the Prevnar pneumonia shot  Please continue all other medications as before, and refills have been done if requested.  Please have the pharmacy call with any other refills you may need.  Please continue your efforts at being more active, low cholesterol diet, and weight control.  You are otherwise up to date with prevention measures today.  Please keep your appointments with your specialists as you may have planned  You will be contacted regarding the referral for: colonoscopy, and the Dermatology referral  Please go to the LAB in the Basement (turn left off the elevator) for the tests to be done today  You will be contacted by phone if any changes need to be made immediately.  Otherwise, you will receive a letter about your results with an explanation, but please check with MyChart first.  Please remember to sign up for MyChart if you have not done so, as this will be important to you in the future with finding out test results, communicating by private email, and scheduling acute appointments online when needed.  Please return in 1 year for your yearly visit, or sooner if needed, with Lab testing done 3-5 days before

## 2015-08-22 NOTE — Progress Notes (Signed)
Subjective:    Patient ID: Maxwell Hinton, male    DOB: 07-14-1946, 69 y.o.   MRN: [id number]  HPI  Here for wellness and f/u;  Overall doing ok;  Pt denies Chest pain, worsening SOB, DOE, wheezing, orthopnea, PND, worsening LE edema, palpitations, dizziness or syncope.  Pt denies neurological change such as new headache, facial or extremity weakness.  Pt denies polydipsia, polyuria, or low sugar symptoms. Pt states overall good compliance with treatment and medications, good tolerability, and has been trying to follow appropriate diet.  Pt denies worsening depressive symptoms, suicidal ideation or panic. No fever, night sweats, wt loss, loss of appetite, or other constitutional symptoms.  Pt states good ability with ADL's, has low fall risk, home safety reviewed and adequate, no other significant changes in hearing or vision, and only occasionally active with exercise.  No current complaints except for worsening skin changes to post hands - ? Actinic lesios Past Medical History  Diagnosis Date  . Hyperlipidemia 2010     LDL 115  . Other abnormal glucose 2010  . Gilbert's syndrome 2010   Past Surgical History  Procedure Laterality Date  . Total hip arthroplasty    . Tonsillectomy and adenoidectomy    . Colonoscopy with polypectomy      X2 ; hyperplastic    reports that he has never smoked. He has never used smokeless tobacco. He reports that he drinks about 8.4 oz of alcohol per week. He reports that he does not use illicit drugs. family history includes Diabetes in his mother; Lung cancer in his brother. There is no history of Heart disease or Stroke. No Known Allergies Current Outpatient Prescriptions on File Prior to Visit  Medication Sig Dispense Refill  . aspirin EC 81 MG tablet Take 1 tablet (81 mg total) by mouth daily. (Patient not taking: Reported on 08/22/2015) 90 tablet 11  . azithromycin (ZITHROMAX Z-PAK) 250 MG tablet Use as directed (Patient not taking: Reported on  08/22/2015) 6 tablet 1  . cyclobenzaprine (FLEXERIL) 10 MG tablet Take 1 tablet (10 mg total) by mouth at bedtime as needed for muscle spasms. (Patient not taking: Reported on 08/22/2015) 21 tablet 0   No current facility-administered medications on file prior to visit.   Review of Systems Constitutional: Negative for increased diaphoresis, other activity, appetite or siginficant weight change other than noted HENT: Negative for worsening hearing loss, ear pain, facial swelling, mouth sores and neck stiffness.   Eyes: Negative for other worsening pain, redness or visual disturbance.  Respiratory: Negative for shortness of breath and wheezing  Cardiovascular: Negative for chest pain and palpitations.  Gastrointestinal: Negative for diarrhea, blood in stool, abdominal distention or other pain Genitourinary: Negative for hematuria, flank pain or change in urine volume.  Musculoskeletal: Negative for myalgias or other joint complaints.  Skin: Negative for color change and wound or drainage.  Neurological: Negative for syncope and numbness. other than noted Hematological: Negative for adenopathy. or other swelling Psychiatric/Behavioral: Negative for hallucinations, SI, self-injury, decreased concentration or other worsening agitation.      Objective:   Physical Exam BP 128/84 mmHg  Pulse 84  Temp(Src) 97.6 F (36.4 C) (Oral)  Ht 6\' 7"  (2.007 m)  Wt 271 lb (122.925 kg)  BMI 30.52 kg/m2  SpO2 96% VS noted,  Constitutional: Pt is oriented to person, place, and time. Appears well-developed and well-nourished, in no significant distress Head: Normocephalic and atraumatic.  Right Ear: External ear normal.  Left Ear: External ear normal.  Nose: Nose normal.  Mouth/Throat: Oropharynx is clear and moist.  Eyes: Conjunctivae and EOM are normal. Pupils are equal, round, and reactive to light.  Neck: Normal range of motion. Neck supple. No JVD present. No tracheal deviation present or significant  neck LA or mass Cardiovascular: Normal rate, regular rhythm, normal heart sounds and intact distal pulses.   Pulmonary/Chest: Effort normal and breath sounds without rales or wheezing  Abdominal: Soft. Bowel sounds are normal. NT. No HSM  Musculoskeletal: Normal range of motion. Exhibits no edema.  Lymphadenopathy:  Has no cervical adenopathy.  Neurological: Pt is alert and oriented to person, place, and time. Pt has normal reflexes. No cranial nerve deficit. Motor grossly intact Skin: Skin is warm and dry. No rash noted. except for scaly white/tan lesions to post hands Psychiatric:  Has normal mood and affect. Behavior is normal.     Assessment & Plan:

## 2015-08-22 NOTE — Assessment & Plan Note (Signed)

## 2015-08-22 NOTE — Assessment & Plan Note (Signed)
Also due for f/u colonoscopy, pt cannot recall name of last GI, had what sounds like adenomatous polyps, asked to f/u at 7 ys from 2011

## 2015-08-22 NOTE — Progress Notes (Signed)
Pre visit review using our clinic review tool, if applicable. No additional management support is needed unless otherwise documented below in the visit note. 

## 2015-10-02 ENCOUNTER — Other Ambulatory Visit: Payer: Self-pay | Admitting: Internal Medicine

## 2015-10-02 ENCOUNTER — Ambulatory Visit (INDEPENDENT_AMBULATORY_CARE_PROVIDER_SITE_OTHER): Payer: Medicare Other

## 2015-10-02 DIAGNOSIS — Z20828 Contact with and (suspected) exposure to other viral communicable diseases: Secondary | ICD-10-CM

## 2015-10-02 DIAGNOSIS — Z23 Encounter for immunization: Secondary | ICD-10-CM | POA: Diagnosis not present

## 2016-01-03 ENCOUNTER — Encounter: Payer: Self-pay | Admitting: Gastroenterology

## 2016-03-12 ENCOUNTER — Ambulatory Visit (AMBULATORY_SURGERY_CENTER): Payer: Self-pay

## 2016-03-12 VITALS — Ht 78.75 in | Wt 271.6 lb

## 2016-03-12 DIAGNOSIS — Z860100 Personal history of colon polyps, unspecified: Secondary | ICD-10-CM

## 2016-03-12 NOTE — Progress Notes (Signed)
No allergies to eggs or soy No past problems with anesthesia No home oxygen No diet meds  Has email and internet; declined emmi 

## 2016-03-20 ENCOUNTER — Encounter: Payer: Self-pay | Admitting: Gastroenterology

## 2016-04-02 ENCOUNTER — Encounter: Payer: Self-pay | Admitting: Gastroenterology

## 2016-04-02 ENCOUNTER — Ambulatory Visit (AMBULATORY_SURGERY_CENTER): Payer: Medicare Other | Admitting: Gastroenterology

## 2016-04-02 VITALS — BP 103/63 | HR 66 | Temp 96.6°F | Resp 18 | Ht 78.0 in | Wt 271.0 lb

## 2016-04-02 DIAGNOSIS — Z1211 Encounter for screening for malignant neoplasm of colon: Secondary | ICD-10-CM

## 2016-04-02 DIAGNOSIS — Z86010 Personal history of colonic polyps: Secondary | ICD-10-CM

## 2016-04-02 MED ORDER — SODIUM CHLORIDE 0.9 % IV SOLN
500.0000 mL | INTRAVENOUS | Status: DC
Start: 2016-04-02 — End: 2016-04-02

## 2016-04-02 NOTE — Patient Instructions (Signed)
YOU HAD AN ENDOSCOPIC PROCEDURE TODAY AT [facility]:   Refer to the procedure report that was given to you for any specific questions about what was found during the examination.  If the procedure report does not answer your questions, please call your gastroenterologist to clarify.  If you requested that your care partner not be given the details of your procedure findings, then the procedure report has been included in a sealed envelope for you to review at your convenience later.  YOU SHOULD EXPECT: Some feelings of bloating in the abdomen. Passage of more gas than usual.  Walking can help get rid of the air that was put into your GI tract during the procedure and reduce the bloating. If you had a lower endoscopy (such as a colonoscopy or flexible sigmoidoscopy) you may notice spotting of blood in your stool or on the toilet paper. If you underwent a bowel prep for your procedure, you may not have a normal bowel movement for a few days.  Please Note:  You might notice some irritation and congestion in your nose or some drainage.  This is from the oxygen used during your procedure.  There is no need for concern and it should clear up in a day or so.  SYMPTOMS TO REPORT IMMEDIATELY:   Following lower endoscopy (colonoscopy or flexible sigmoidoscopy):  Excessive amounts of blood in the stool  Significant tenderness or worsening of abdominal pains  Swelling of the abdomen that is new, acute  Fever of 100F or higher  For urgent or emergent issues, a gastroenterologist can be reached at any hour by calling [phone].   DIET: Your first meal following the procedure should be a small meal and then it is ok to progress to your normal diet. Heavy or fried foods are harder to digest and may make you feel nauseous or bloated.  Likewise, meals heavy in dairy and vegetables can increase bloating.  Drink plenty of fluids but you should avoid alcoholic beverages for 24  hours.  ACTIVITY:  You should plan to take it easy for the rest of today and you should NOT DRIVE or use heavy machinery until tomorrow (because of the sedation medicines used during the test).    FOLLOW UP: Our staff will call the number listed on your records the next business day following your procedure to check on you and address any questions or concerns that you may have regarding the information given to you following your procedure. If we do not reach you, we will leave a message.  However, if you are feeling well and you are not experiencing any problems, there is no need to return our call.  We will assume that you have returned to your regular daily activities without incident.  If any biopsies were taken you will be contacted by phone or by letter within the next 1-3 weeks.  Please call us at [phone] if you have not heard about the biopsies in 3 weeks.    SIGNATURES/CONFIDENTIALITY: You and/or your care partner have signed paperwork which will be entered into your electronic medical record.  These signatures attest to the fact that that the information above on your After Visit Summary has been reviewed and is understood.  Full responsibility of the confidentiality of this discharge information lies with you and/or your care-partner.  Next colonoscopy 10 years.

## 2016-04-02 NOTE — Op Note (Addendum)
[facility] Patient Name: Maxwell Hinton Procedure Date: 04/02/2016 10:12 AM MRN: [id number] Endoscopist: Milus Banister , MD Age: 70 Referring MD:  Date of Birth: Jul 20, 1946 Gender: Male Account #: [id number] Procedure:                Colonoscopy Indications:              Screening for colorectal malignant neoplasm; small                            HP removed 2007 Colonoscopy Medicines:                Monitored Anesthesia Care Procedure:                Pre-Anesthesia Assessment:                           - Prior to the procedure, a History and Physical                            was performed, and patient medications and                            allergies were reviewed. The patient's tolerance of                            previous anesthesia was also reviewed. The risks                            and benefits of the procedure and the sedation                            options and risks were discussed with the patient.                            All questions were answered, and informed consent                            was obtained. Prior Anticoagulants: The patient has                            taken no previous anticoagulant or antiplatelet                            agents. ASA Grade Assessment: II - A patient with                            mild systemic disease. After reviewing the risks                            and benefits, the patient was deemed in                            satisfactory condition to undergo the procedure.  After obtaining informed consent, the colonoscope                            was passed under direct vision. Throughout the                            procedure, the patient's blood pressure, pulse, and                            oxygen saturations were monitored continuously. The                            Model CF-HQ190L [id number]) scope was introduced                            through the anus and advanced  to the the cecum,                            identified by appendiceal orifice and ileocecal                            valve. The colonoscopy was performed without                            difficulty. The patient tolerated the procedure                            well. The quality of the bowel preparation was                            excellent. The ileocecal valve, appendiceal                            orifice, and rectum were photographed. Scope In: 10:19:25 AM Scope Out: 10:29:44 AM Scope Withdrawal Time: 0 hours 7 minutes 54 seconds  Total Procedure Duration: 0 hours 10 minutes 19 seconds  Findings:                 The entire examined colon appeared normal on direct                            and retroflexion views. Complications:            No immediate complications. Estimated blood loss:                            None. Estimated Blood Loss:     Estimated blood loss: none. Impression:               - The entire examined colon is normal on direct and                            retroflexion views.                           - No specimens collected. Recommendation:           -  Patient has a contact number available for                            emergencies. The signs and symptoms of potential                            delayed complications were discussed with the                            patient. Return to normal activities tomorrow.                            Written discharge instructions were provided to the                            patient.                           - Resume previous diet.                           - Continue present medications.                           - Repeat colonoscopy in 10 years for screening                            purposes. Milus Banister, MD 04/02/2016 10:34:59 AM This report has been signed electronically.

## 2016-04-03 ENCOUNTER — Telehealth: Payer: Self-pay

## 2016-04-03 NOTE — Telephone Encounter (Signed)
Left message on answering machine. 

## 2016-11-26 DIAGNOSIS — D485 Neoplasm of uncertain behavior of skin: Secondary | ICD-10-CM | POA: Diagnosis not present

## 2016-11-26 DIAGNOSIS — D0439 Carcinoma in situ of skin of other parts of face: Secondary | ICD-10-CM | POA: Diagnosis not present

## 2016-11-26 DIAGNOSIS — L821 Other seborrheic keratosis: Secondary | ICD-10-CM | POA: Diagnosis not present

## 2016-11-26 DIAGNOSIS — L814 Other melanin hyperpigmentation: Secondary | ICD-10-CM | POA: Diagnosis not present

## 2016-11-26 DIAGNOSIS — D1801 Hemangioma of skin and subcutaneous tissue: Secondary | ICD-10-CM | POA: Diagnosis not present

## 2017-01-06 DIAGNOSIS — D0439 Carcinoma in situ of skin of other parts of face: Secondary | ICD-10-CM | POA: Diagnosis not present

## 2017-05-08 ENCOUNTER — Telehealth: Payer: Self-pay | Admitting: Cardiovascular Disease

## 2017-05-08 ENCOUNTER — Other Ambulatory Visit (INDEPENDENT_AMBULATORY_CARE_PROVIDER_SITE_OTHER): Payer: Medicare Other

## 2017-05-08 ENCOUNTER — Encounter: Payer: Self-pay | Admitting: Nurse Practitioner

## 2017-05-08 ENCOUNTER — Ambulatory Visit (INDEPENDENT_AMBULATORY_CARE_PROVIDER_SITE_OTHER): Payer: Medicare Other | Admitting: Nurse Practitioner

## 2017-05-08 VITALS — BP 120/82 | HR 62 | Temp 97.5°F | Ht 78.0 in | Wt 262.0 lb

## 2017-05-08 DIAGNOSIS — I493 Ventricular premature depolarization: Secondary | ICD-10-CM | POA: Diagnosis not present

## 2017-05-08 DIAGNOSIS — R9431 Abnormal electrocardiogram [ECG] [EKG]: Secondary | ICD-10-CM

## 2017-05-08 DIAGNOSIS — R42 Dizziness and giddiness: Secondary | ICD-10-CM

## 2017-05-08 DIAGNOSIS — R112 Nausea with vomiting, unspecified: Secondary | ICD-10-CM | POA: Diagnosis not present

## 2017-05-08 LAB — BASIC METABOLIC PANEL
BUN: 23 mg/dL (ref 6–23)
CHLORIDE: 103 meq/L (ref 96–112)
CO2: 29 mEq/L (ref 19–32)
CREATININE: 1.08 mg/dL (ref 0.40–1.50)
Calcium: 10 mg/dL (ref 8.4–10.5)
GFR: 72.04 mL/min (ref >60.00)
GLUCOSE: 149 mg/dL — AB (ref 70–99)
Potassium: 4.4 mEq/L (ref 3.5–5.1)
Sodium: 139 mEq/L (ref 135–145)

## 2017-05-08 LAB — CBC
HEMATOCRIT: 46.5 % (ref 39.0–52.0)
Hemoglobin: 16.2 g/dL (ref 13.0–17.0)
MCHC: 34.8 g/dL (ref 30.0–36.0)
MCV: 87.3 fl (ref 78.0–100.0)
Platelets: 257 10*3/uL (ref 150.0–400.0)
RBC: 5.33 Mil/uL (ref 4.22–5.81)
RDW: 13.3 % (ref 11.5–15.5)
WBC: 5.6 10*3/uL (ref 4.0–10.5)

## 2017-05-08 LAB — TSH: TSH: 1.05 u[IU]/mL (ref 0.35–4.50)

## 2017-05-08 MED ORDER — PROMETHAZINE HCL 25 MG/ML IJ SOLN
25.0000 mg | Freq: Once | INTRAMUSCULAR | Status: AC
  Administered 2017-05-08: 25 mg via INTRAMUSCULAR

## 2017-05-08 MED ORDER — ASPIRIN EC 81 MG PO TBEC: 162.0000 mg | DELAYED_RELEASE_TABLET | Freq: Every day | ORAL | 11 refills | Status: DC

## 2017-05-08 MED ORDER — PROMETHAZINE HCL 12.5 MG PO TABS: 12.5000 mg | ORAL_TABLET | Freq: Three times a day (TID) | ORAL | 0 refills | Status: DC | PRN

## 2017-05-08 NOTE — Patient Instructions (Addendum)
Collaboration with Dr. Tamala Julian in office and Dr. Alvester Chou (cardiologist) via telephone (doc line). Patient scheduled with Stormy Fabian, [place] 05/21/2017 at [facility] cardiology office on [address], [place].  Push oral hydration.  Change positions slowly.  Go to basement for blood draw. You will be called with results.  Do not drive until symptoms resolve.  Call 911 if symptoms worsen.  Vertigo Vertigo is the feeling that you or your surroundings are moving when they are not. Vertigo can be dangerous if it occurs while you are doing something that could endanger you or others, such as driving. What are the causes? This condition is caused by a disturbance in the signals that are sent by your body's sensory systems to your brain. Different causes of a disturbance can lead to vertigo, including:  Infections, especially in the inner ear.  A bad reaction to a drug, or misuse of alcohol and medicines.  Withdrawal from drugs or alcohol.  Quickly changing positions, as when lying down or rolling over in bed.  Migraine headaches.  Decreased blood flow to the brain.  Decreased blood pressure.  Increased pressure in the brain from a head or neck injury, stroke, infection, tumor, or bleeding.  Central nervous system disorders.  What are the signs or symptoms? Symptoms of this condition usually occur when you move your head or your eyes in different directions. Symptoms may start suddenly, and they usually last for less than a minute. Symptoms may include:  Loss of balance and falling.  Feeling like you are spinning or moving.  Feeling like your surroundings are spinning or moving.  Nausea and vomiting.  Blurred vision or double vision.  Difficulty hearing.  Slurred speech.  Dizziness.  Involuntary eye movement (nystagmus).  Symptoms can be mild and cause only slight annoyance, or they can be severe and interfere with daily life. Episodes of vertigo may return (recur) over time, and  they are often triggered by certain movements. Symptoms may improve over time. How is this diagnosed? This condition may be diagnosed based on medical history and the quality of your nystagmus. Your health care provider may test your eye movements by asking you to quickly change positions to trigger the nystagmus. This may be called the Dix-Hallpike test, head thrust test, or roll test. You may be referred to a health care provider who specializes in ear, nose, and throat (ENT) problems (otolaryngologist) or a provider who specializes in disorders of the central nervous system (neurologist). You may have additional testing, including:  A physical exam.  Blood tests.  MRI.  A CT scan.  An electrocardiogram (ECG). This records electrical activity in your heart.  An electroencephalogram (EEG). This records electrical activity in your brain.  Hearing tests.  How is this treated? Treatment for this condition depends on the cause and the severity of the symptoms. Treatment options include:  Medicines to treat nausea or vertigo. These are usually used for severe cases. Some medicines that are used to treat other conditions may also reduce or eliminate vertigo symptoms. These include: ? Medicines that control allergies (antihistamines). ? Medicines that control seizures (anticonvulsants). ? Medicines that relieve depression (antidepressants). ? Medicines that relieve anxiety (sedatives).  Head movements to adjust your inner ear back to normal. If your vertigo is caused by an ear problem, your health care provider may recommend certain movements to correct the problem.  Surgery. This is rare.  Follow these instructions at home: Safety  Move slowly.Avoid sudden body or head movements.  Avoid driving.  Avoid operating heavy machinery.  Avoid doing any tasks that would cause danger to you or others if you would have a vertigo episode during the task.  If you have trouble walking or  keeping your balance, try using a cane for stability. If you feel dizzy or unstable, sit down right away.  Return to your normal activities as told by your health care provider. Ask your health care provider what activities are safe for you. General instructions  Take over-the-counter and prescription medicines only as told by your health care provider.  Avoid certain positions or movements as told by your health care provider.  Drink enough fluid to keep your urine clear or pale yellow.  Keep all follow-up visits as told by your health care provider. This is important. Contact a health care provider if:  Your medicines do not relieve your vertigo or they make it worse.  You have a fever.  Your condition gets worse or you develop new symptoms.  Your family or friends notice any behavioral changes.  Your nausea or vomiting gets worse.  You have numbness or a "pins and needles" sensation in part of your body. Get help right away if:  You have difficulty moving or speaking.  You are always dizzy.  You faint.  You develop severe headaches.  You have weakness in your hands, arms, or legs.  You have changes in your hearing or vision.  You develop a stiff neck.  You develop sensitivity to light. This information is not intended to replace advice given to you by your health care provider. Make sure you discuss any questions you have with your health care provider. Document Released: 07/09/2005 Document Revised: 03/12/2016 Document Reviewed: 01/22/2015 Elsevier Interactive Patient Education  2017 [employer].

## 2017-05-08 NOTE — Progress Notes (Signed)
Subjective:  Patient ID: Maxwell Hinton, male    DOB: 03/05/1946  Age: 71 y.o. MRN: [id number]  CC: Dizziness (felt dizzy this morning about 6am--had a hot flash--nausea)   Dizziness  This is a new problem. The current episode started today. The problem occurs constantly. The problem has been unchanged. Associated symptoms include nausea and vertigo. Pertinent negatives include no abdominal pain, anorexia, arthralgias, change in bowel habit, chest pain, congestion, coughing, fatigue, fever, headaches, myalgias, neck pain, numbness, sore throat, urinary symptoms, visual change or weakness. The symptoms are aggravated by twisting and standing. He has tried rest for the symptoms. The treatment provided mild relief.    Outpatient Medications Prior to Visit  Medication Sig Dispense Refill  . loratadine (CLARITIN) 10 MG tablet Take 10 mg by mouth daily as needed for allergies.    Marland Kitchen aspirin EC 81 MG tablet Take 1 tablet (81 mg total) by mouth daily. 90 tablet 11   No facility-administered medications prior to visit.     ROS See HPI  Objective:  BP 120/82   Pulse 62   Temp (!) 97.5 F (36.4 C)   Ht 6\' 6"  (1.981 m)   Wt 262 lb (118.8 kg)   SpO2 97%   BMI 30.28 kg/m   BP Readings from Last 3 Encounters:  05/08/17 120/82  04/02/16 103/63  08/22/15 128/84    Wt Readings from Last 3 Encounters:  05/08/17 262 lb (118.8 kg)  04/02/16 271 lb (122.9 kg)  03/12/16 271 lb 9.6 oz (123.2 kg)    Physical Exam  Constitutional: He is oriented to person, place, and time. No distress.  Neck: Normal range of motion. Neck supple. No JVD present. No thyromegaly present.  Cardiovascular: Normal rate and intact distal pulses.  An irregular rhythm present. Exam reveals no gallop and no friction rub.   No murmur heard. Pulses:      Carotid pulses are 0 on the right side. Pulmonary/Chest: Effort normal and breath sounds normal.  Lymphadenopathy:    He has no cervical adenopathy.    Neurological: He is alert and oriented to person, place, and time. Coordination normal.  Positive nystagmus with head rotation to left  Skin: Skin is warm and dry.  Vitals reviewed.   Lab Results  Component Value Date   WBC 5.6 05/08/2017   HGB 16.2 05/08/2017   HCT 46.5 05/08/2017   PLT 257.0 05/08/2017   GLUCOSE 149 (H) 05/08/2017   CHOL 185 03/01/2013   TRIG 82.0 03/01/2013   HDL 50.80 03/01/2013   LDLCALC 118 (H) 03/01/2013   ALT 21 08/22/2015   AST 16 08/22/2015   NA 139 05/08/2017   K 4.4 05/08/2017   CL 103 05/08/2017   CREATININE 1.08 05/08/2017   BUN 23 05/08/2017   CO2 29 05/08/2017   TSH 1.05 05/08/2017   PSA 0.87 08/22/2015   ECG: sinus rhythm with frequent PVCs, new T-wave inversion in lateral leads (V3-V5).  Assessment & Plan:  Collaboration with Dr. Tamala Julian in office and Dr. Alvester Chou (cardiologist) via telephone (doc line). Patient scheduled with Stormy Fabian, [place] 05/21/2017 at [facility] cardiology office on [place], [place].  Maxwell Hinton was seen today for dizziness.  Diagnoses and all orders for this visit:  Frequent PVCs -     EKG 12-Lead -     Basic metabolic panel; Future -     CBC; Future -     TSH; Future -     Ambulatory referral to Cardiology -  aspirin EC 81 MG tablet; Take 2 tablets (162 mg total) by mouth daily.  Vertigo -     EKG 12-Lead -     Basic metabolic panel; Future -     CBC; Future -     TSH; Future -     promethazine (PHENERGAN) 12.5 MG tablet; Take 1 tablet (12.5 mg total) by mouth every 8 (eight) hours as needed for nausea or vomiting.  Non-intractable vomiting with nausea, unspecified vomiting type -     promethazine (PHENERGAN) injection 25 mg; Inject 1 mL (25 mg total) into the muscle once. -     promethazine (PHENERGAN) 12.5 MG tablet; Take 1 tablet (12.5 mg total) by mouth every 8 (eight) hours as needed for nausea or vomiting.  Abnormal resting ECG findings -     Ambulatory referral to Cardiology -     aspirin EC 81 MG  tablet; Take 2 tablets (162 mg total) by mouth daily.   I have changed Mr. Makris's aspirin EC. I am also having him start on promethazine. Additionally, I am having him maintain his loratadine. We administered promethazine.  Meds ordered this encounter  Medications  . promethazine (PHENERGAN) injection 25 mg  . promethazine (PHENERGAN) 12.5 MG tablet    Sig: Take 1 tablet (12.5 mg total) by mouth every 8 (eight) hours as needed for nausea or vomiting.    Dispense:  20 tablet    Refill:  0    Order Specific Question:   Supervising Provider    Answer:   Cassandria Anger [[id number]]  . aspirin EC 81 MG tablet    Sig: Take 2 tablets (162 mg total) by mouth daily.    Dispense:  90 tablet    Refill:  11    Order Specific Question:   Supervising Provider    Answer:   Cassandria Anger [[id number]]    Follow-up: Return if symptoms worsen or fail to improve.  Wilfred Lacy, NP

## 2017-05-08 NOTE — Telephone Encounter (Signed)
Appointment scheduled with Bernerd Pho, [place] on 05/21/17 per Dr. Gwenlyn Found DOD, 7/27. Referred by [facility], Wilfred Lacy, NP for new PVCs.

## 2017-05-11 ENCOUNTER — Encounter: Payer: Self-pay | Admitting: *Deleted

## 2017-05-13 ENCOUNTER — Ambulatory Visit (INDEPENDENT_AMBULATORY_CARE_PROVIDER_SITE_OTHER): Payer: Medicare Other | Admitting: Internal Medicine

## 2017-05-13 ENCOUNTER — Encounter: Payer: Self-pay | Admitting: Internal Medicine

## 2017-05-13 DIAGNOSIS — H6692 Otitis media, unspecified, left ear: Secondary | ICD-10-CM | POA: Diagnosis not present

## 2017-05-13 DIAGNOSIS — R739 Hyperglycemia, unspecified: Secondary | ICD-10-CM

## 2017-05-13 LAB — POCT GLYCOSYLATED HEMOGLOBIN (HGB A1C): Hemoglobin A1C: 5.3

## 2017-05-13 MED ORDER — AZITHROMYCIN 250 MG PO TABS: ORAL_TABLET | ORAL | 1 refills | Status: DC

## 2017-05-13 NOTE — Patient Instructions (Signed)
Your A1c is OK today  Please take all new medication as prescribed - the antibiotic  You can also take Delsym OTC for cough, and/or Mucinex (or it's generic off brand) for congestion, and tylenol as needed for pain.  Please continue all other medications as before, and refills have been done if requested.  Please have the pharmacy call with any other refills you may need..  Please keep your appointments with your specialists as you may have planned

## 2017-05-13 NOTE — Progress Notes (Signed)
Subjective:    Patient ID: Maxwell Hinton, male    DOB: [birth year], 71 y.o.   MRN: [id number]  HPI   Here with 2-3 days acute onset fever, left ear pain, pressure, headache, general weakness and malaise, without d/c, but pt denies chest pain, wheezing, increased sob or doe, orthopnea, PND, increased LE swelling, palpitations, dizziness or syncope.   Pt denies polydipsia, polyuria Was seen at [facility] 7/27 with veritgo now resolved.      Past Medical History:  Diagnosis Date  . GERD (gastroesophageal reflux disease)   . Gilbert's syndrome 2010  . Hyperlipidemia 2010    LDL 115  . Other abnormal glucose 2010  . Skin cancer    Past Surgical History:  Procedure Laterality Date  . colonoscopy with polypectomy     X2 ; hyperplastic  . TONSILLECTOMY AND ADENOIDECTOMY    . TOTAL HIP ARTHROPLASTY      reports that he has never smoked. He has never used smokeless tobacco. He reports that he drinks about 1.2 oz of alcohol per week . He reports that he does not use drugs. family history includes Diabetes in his mother; Lung cancer in his brother. No Known Allergies Current Outpatient Prescriptions on File Prior to Visit  Medication Sig Dispense Refill  . aspirin EC 81 MG tablet Take 2 tablets (162 mg total) by mouth daily. 90 tablet 11  . loratadine (CLARITIN) 10 MG tablet Take 10 mg by mouth daily as needed for allergies.    . promethazine (PHENERGAN) 12.5 MG tablet Take 1 tablet (12.5 mg total) by mouth every 8 (eight) hours as needed for nausea or vomiting. 20 tablet 0   No current facility-administered medications on file prior to visit.    Review of Systems All otherwise neg per pt    Objective:   Physical Exam BP 132/84   Pulse 69   Ht 6\' 6"  (1.981 m)   Wt 264 lb (119.7 kg)   SpO2 98%   BMI 30.51 kg/m  VS noted,  Constitutional: Pt appears in NAD HENT: Head: NCAT.  Right Ear: External ear normal.  Left Ear: External ear normal.  Eyes: . Pupils are equal, round, and reactive  to light. Conjunctivae and EOM are normal Right tm's without erythema, left is 1-2+ erythema.  Max sinus areas non tender.  Pharynx with mild erythema, no exudate Nose: without d/c or deformity Neck: Neck supple. Gross normal ROM Cardiovascular: Normal rate and regular rhythm.   Pulmonary/Chest: Effort normal and breath sounds without rales or wheezing.  Neurological: Pt is alert. At baseline orientation, motor grossly intact Skin: Skin is warm. No rashes, other new lesions, no LE edema Psychiatric: Pt behavior is normal without agitation  No other exam findings  Lab Results  Component Value Date   WBC 5.6 05/08/2017   HGB 16.2 05/08/2017   HCT 46.5 05/08/2017   PLT 257.0 05/08/2017   GLUCOSE 149 (H) 05/08/2017   CHOL 185 03/01/2013   TRIG 82.0 03/01/2013   HDL 50.80 03/01/2013   LDLCALC 118 (H) 03/01/2013   ALT 21 08/22/2015   AST 16 08/22/2015   NA 139 05/08/2017   K 4.4 05/08/2017   CL 103 05/08/2017   CREATININE 1.08 05/08/2017   BUN 23 05/08/2017   CO2 29 05/08/2017   TSH 1.05 05/08/2017   PSA 0.87 08/22/2015   POCT glycosylated hemoglobin (Hb A1C)  Order: [id number]  Status:  Final result Visible to patient:  No (Not Released) Dx:  Hyperglycemia  Component 18:47  Hemoglobin A1C 5.3            Assessment & Plan:

## 2017-05-15 NOTE — Assessment & Plan Note (Signed)
Lab Results  Component Value Date   HGBA1C 5.3 05/13/2017  stable overall by history and exam, recent data reviewed with pt, and pt to continue medical treatment as before,  to f/u any worsening symptoms or concerns

## 2017-05-15 NOTE — Assessment & Plan Note (Signed)
Mild to mod, for antibx course,  to f/u any worsening symptoms or concerns 

## 2017-05-20 NOTE — Progress Notes (Signed)
Cardiology Office Note    Date:  05/21/2017   ID:  Maxwell, Hinton 25-Jun-1946, MRN [id number]  PCP:  Biagio Borg, MD  Cardiologist: New to [facility] - Dr. Debara Pickett  Chief Complaint  Patient presents with  . New Patient (Initial Visit)    Referral for PVC's    History of Present Illness:    Maxwell Hinton is a 71 y.o. male with past medical history of Gilbert's syndrome and GERD who presents to the office today for evaluation of PVC's at the request of Maxwell Lacy, NP.   He was recently evaluated by his PCP on 05/08/2017 and reported new onset dizziness and nausea. He denied any chest pain or dyspnea at that time. An EKG was obtained and showed NSR, HR 63, with frequent PVC's in a trigeminal pattern, therefore Cardiology follow-up was recommended. He went back to see his PCP several days later and was diagnosed with otitis media. He was started on antibiotic treatment at that time and reports his dizziness resolved.  In talking with the patient today, he denies any prior cardiac history. No known HTN, HLD, Type 2 DM, or CAD. He is unaware of any family history of CAD. Does report his mother had diabetes and his brother died from lung cancer. The patient himself denies any regular tobacco use or alcohol use. Reports smoking 1 cigar 1-2 times per year.  He denies any recent history of chest discomfort, dyspnea on exertion, orthopnea, PND, or lower extremity edema. He does not exercise regularly but is active with his job as he is a [employer]. Prior to his recent ear infection he denies any symptoms of dizziness or lightheadedness. No recurrent symptoms since he initially started antibiotic therapy. He denies any history of palpitations or presyncope.   Past Medical History:  Diagnosis Date  . GERD (gastroesophageal reflux disease)   . Gilbert's syndrome 2010  . Other abnormal glucose 2010  . Skin cancer     Past Surgical History:  Procedure Laterality Date  .  colonoscopy with polypectomy     X2 ; hyperplastic  . TONSILLECTOMY AND ADENOIDECTOMY    . TOTAL HIP ARTHROPLASTY      Current Medications: Outpatient Medications Prior to Visit  Medication Sig Dispense Refill  . aspirin EC 81 MG tablet Take 2 tablets (162 mg total) by mouth daily. 90 tablet 11  . azithromycin (ZITHROMAX Z-PAK) 250 MG tablet 2 tab by mouth day 1, then 1 per day 6 tablet 1  . loratadine (CLARITIN) 10 MG tablet Take 10 mg by mouth daily as needed for allergies.    . promethazine (PHENERGAN) 12.5 MG tablet Take 1 tablet (12.5 mg total) by mouth every 8 (eight) hours as needed for nausea or vomiting. 20 tablet 0   No facility-administered medications prior to visit.      Allergies:   Patient has no known allergies.   Social History   Social History  . Marital status: Single    Spouse name: N/A  . Number of children: 2  . Years of education: N/A   Social History Main Topics  . Smoking status: Current Some Day Smoker    Types: Cigars  . Smokeless tobacco: Never Used  . Alcohol use 1.2 oz/week    2 Glasses of wine per week  . Drug use: No  . Sexual activity: Not Asked   Other Topics Concern  . None   Social History Narrative  . None  Family History:  The patient's family history includes Diabetes in his mother; Lung cancer in his brother.   Review of Systems:   Please see the history of present illness.     General:  No chills, fever, night sweats or weight changes.  Cardiovascular:  No chest pain, dyspnea on exertion, edema, orthopnea, palpitations, paroxysmal nocturnal dyspnea. Dermatological: No rash, lesions/masses Respiratory: No cough, dyspnea Urologic: No hematuria, dysuria Abdominal:   No nausea, vomiting, diarrhea, bright red blood per rectum, melena, or hematemesis Neurologic:  No visual changes, wkns, changes in mental status. Positive for dizziness (now resolved).   All other systems reviewed and are otherwise negative except as noted  above.   Physical Exam:    VS:  BP 140/88 (BP Location: Right Arm, Cuff Size: Normal)   Pulse 68   Ht 6\' 7"  (2.007 m)   Wt 264 lb (119.7 kg)   BMI 29.74 kg/m    General: Well developed, well nourished Caucasian male appearing in no acute distress. Head: Normocephalic, atraumatic, sclera non-icteric, no xanthomas, nares are without discharge.  Neck: No carotid bruits. JVD not elevated.  Lungs: Respirations regular and unlabored, without wheezes or rales.  Heart: Regular rate and rhythm. No S3 or S4.  No murmur, no rubs, or gallops appreciated. Abdomen: Soft, non-tender, non-distended with normoactive bowel sounds. No hepatomegaly. No rebound/guarding. No obvious abdominal masses. Msk:  Strength and tone appear normal for age. No joint deformities or effusions. Extremities: No clubbing or cyanosis. No lower extremity edema.  Distal pedal pulses are 2+ bilaterally. Neuro: Alert and oriented X 3. Moves all extremities spontaneously. No focal deficits noted. Psych:  Responds to questions appropriately with a normal affect. Skin: No rashes or lesions noted  Wt Readings from Last 3 Encounters:  05/21/17 264 lb (119.7 kg)  05/13/17 264 lb (119.7 kg)  05/08/17 262 lb (118.8 kg)     Studies/Labs Reviewed:   EKG:  EKG is ordered today.  The ekg ordered today demonstrates NSR, HR 71, with no acute ST or T-wave changes.   Recent Labs: 05/08/2017: BUN 23; Creatinine, Ser 1.08; Hemoglobin 16.2; Platelets 257.0; Potassium 4.4; Sodium 139; TSH 1.05   Lipid Panel    Component Value Date/Time   CHOL 185 03/01/2013 0944   TRIG 82.0 03/01/2013 0944   HDL 50.80 03/01/2013 0944   CHOLHDL 4 03/01/2013 0944   VLDL 16.4 03/01/2013 0944   LDLCALC 118 (H) 03/01/2013 0944    Additional studies/ records that were reviewed today include:   EKG: 05/08/2017: NSR, HR 63, with frequent PVC's   Assessment:    1. PVC (premature ventricular contraction)   2. Dizziness   3. Gastroesophageal reflux  disease, esophagitis presence not specified      Plan:   In order of problems listed above:  1. Premature Ventricular Contractions/ Dizziness - the patient was recently evaluated for dizziness, found to have an inner ear infection and reports his symptoms improved with treatment of this. His EKG at that time did show NSR, HR 63, with frequent PVC's in a trigeminal pattern. He reports being asymptomatic with this, denying any chest pain, palpitations, dyspnea, or presyncope.  - he is not overly active at baseline but denies any chest pain or dyspnea when walking around his job sites. No known cardiac risk factors of HTN, HLD, Type 2 DM, CAD, or tobacco use.  - he denies any recurrent dizziness since he was started on antibiotic treatment for this otitis media. Overall, his dizziness was likely  related to his acute illness. Repeat EKG today shows NSR with no frequent ectopy. I reviewed with the patient that PAC's and PVC's are benign and do not require treatment. Labs were checked by his PCP at that time and showed no acute abnormalities. I discussed the patient with Dr. Debara Pickett (DOD) as the patient is new to our practice and he recommended an ETT to assess for any exercise-induced arrhythmias and as screening for significant coronary disease.   2. GERD - uses OTC antiacids as needed.   Medication Adjustments/Labs and Tests Ordered: Current medicines are reviewed at length with the patient today.  Concerns regarding medicines are outlined above.  Medication changes, Labs and Tests ordered today are listed in the Patient Instructions below. Patient Instructions  Medication Instructions: Your physician recommends that you continue on your current medications as directed. Please refer to the Current Medication list given to you today.  If you need a refill on your cardiac medications before your next appointment, please call your pharmacy.    Procedures/Testing: Your physician has requested that  you have an exercise tolerance test. For further information please visit [email or web address]. This will be done at [place], [address]   Follow-Up: Your physician wants you to follow-up as needed with Dr. Debara Pickett.   Thank you for choosing Heartcare at [facility]!!      Signed, Erma Heritage, PA-C  05/21/2017 3:19 PM    [address], [place], [place]  [address] Phone: [phone]; Fax: [phone]  [address], [place], [place] [address] Phone: [phone]

## 2017-05-21 ENCOUNTER — Encounter: Payer: Self-pay | Admitting: Student

## 2017-05-21 ENCOUNTER — Ambulatory Visit (INDEPENDENT_AMBULATORY_CARE_PROVIDER_SITE_OTHER): Payer: Medicare Other | Admitting: Student

## 2017-05-21 VITALS — BP 140/88 | HR 68 | Ht 79.0 in | Wt 264.0 lb

## 2017-05-21 DIAGNOSIS — R42 Dizziness and giddiness: Secondary | ICD-10-CM

## 2017-05-21 DIAGNOSIS — I493 Ventricular premature depolarization: Secondary | ICD-10-CM | POA: Diagnosis not present

## 2017-05-21 DIAGNOSIS — K219 Gastro-esophageal reflux disease without esophagitis: Secondary | ICD-10-CM

## 2017-05-21 NOTE — Patient Instructions (Signed)
Medication Instructions: Your physician recommends that you continue on your current medications as directed. Please refer to the Current Medication list given to you today.  If you need a refill on your cardiac medications before your next appointment, please call your pharmacy.    Procedures/Testing: Your physician has requested that you have an exercise tolerance test. For further information please visit [email or web address]. This will be done at [place], [address]   Follow-Up: Your physician wants you to follow-up as needed with Dr. Debara Pickett.   Thank you for choosing Heartcare at [facility]!!

## 2017-06-03 ENCOUNTER — Telehealth (HOSPITAL_COMMUNITY): Payer: Self-pay

## 2017-06-03 NOTE — Telephone Encounter (Signed)
Encounter complete. 

## 2017-06-04 ENCOUNTER — Telehealth (HOSPITAL_COMMUNITY): Payer: Self-pay

## 2017-06-04 NOTE — Telephone Encounter (Signed)
Encounter complete. 

## 2017-06-05 ENCOUNTER — Ambulatory Visit (HOSPITAL_COMMUNITY)
Admission: RE | Admit: 2017-06-05 | Discharge: 2017-06-05 | Disposition: A | Discharge status: Home / Self Care | Payer: Medicare Other | Source: Ambulatory Visit | Attending: Internal Medicine | Admitting: Internal Medicine

## 2017-06-05 DIAGNOSIS — I493 Ventricular premature depolarization: Secondary | ICD-10-CM

## 2017-06-05 LAB — EXERCISE TOLERANCE TEST
CHL CUP MPHR: 151 {beats}/min
CHL CUP RESTING HR STRESS: 73 {beats}/min
CSEPEW: 7.3 METS
Exercise duration (min): 6 min
Exercise duration (sec): 14 s
Peak HR: 155 {beats}/min
Percent HR: 102 %
RPE: 18

## 2017-06-08 ENCOUNTER — Other Ambulatory Visit: Payer: Self-pay | Admitting: Student

## 2017-06-08 DIAGNOSIS — R42 Dizziness and giddiness: Secondary | ICD-10-CM

## 2017-06-08 DIAGNOSIS — R9439 Abnormal result of other cardiovascular function study: Secondary | ICD-10-CM

## 2017-06-12 NOTE — Progress Notes (Signed)
All Information sent to precert.

## 2017-06-24 ENCOUNTER — Encounter: Payer: Self-pay | Admitting: Student

## 2017-07-01 ENCOUNTER — Ambulatory Visit (HOSPITAL_COMMUNITY): Payer: Medicare Other

## 2017-08-27 ENCOUNTER — Ambulatory Visit (INDEPENDENT_AMBULATORY_CARE_PROVIDER_SITE_OTHER): Payer: Medicare Other | Admitting: *Deleted

## 2017-08-27 DIAGNOSIS — Z23 Encounter for immunization: Secondary | ICD-10-CM

## 2018-05-19 ENCOUNTER — Encounter: Payer: Self-pay | Admitting: Internal Medicine

## 2018-05-19 ENCOUNTER — Other Ambulatory Visit (INDEPENDENT_AMBULATORY_CARE_PROVIDER_SITE_OTHER): Payer: Medicare Other

## 2018-05-19 ENCOUNTER — Ambulatory Visit (INDEPENDENT_AMBULATORY_CARE_PROVIDER_SITE_OTHER): Payer: Medicare Other | Admitting: Internal Medicine

## 2018-05-19 VITALS — BP 144/88 | HR 84 | Temp 97.7°F | Ht 79.0 in | Wt 273.0 lb

## 2018-05-19 DIAGNOSIS — Z1159 Encounter for screening for other viral diseases: Secondary | ICD-10-CM

## 2018-05-19 DIAGNOSIS — Z0000 Encounter for general adult medical examination without abnormal findings: Secondary | ICD-10-CM

## 2018-05-19 DIAGNOSIS — R739 Hyperglycemia, unspecified: Secondary | ICD-10-CM | POA: Diagnosis not present

## 2018-05-19 DIAGNOSIS — I10 Essential (primary) hypertension: Secondary | ICD-10-CM

## 2018-05-19 DIAGNOSIS — Z23 Encounter for immunization: Secondary | ICD-10-CM | POA: Diagnosis not present

## 2018-05-19 LAB — PSA: PSA: 1.02 ng/mL (ref 0.10–4.00)

## 2018-05-19 LAB — CBC WITH DIFFERENTIAL/PLATELET
BASOS PCT: 1.3 % (ref 0.0–3.0)
Basophils Absolute: 0.1 10*3/uL (ref 0.0–0.1)
EOS ABS: 0.1 10*3/uL (ref 0.0–0.7)
Eosinophils Relative: 1.5 % (ref 0.0–5.0)
HEMATOCRIT: 43.6 % (ref 39.0–52.0)
Hemoglobin: 15.4 g/dL (ref 13.0–17.0)
LYMPHS PCT: 32 % (ref 12.0–46.0)
Lymphs Abs: 1.6 10*3/uL (ref 0.7–4.0)
MCHC: 35.2 g/dL (ref 30.0–36.0)
MCV: 87.6 fl (ref 78.0–100.0)
Monocytes Absolute: 0.4 10*3/uL (ref 0.1–1.0)
Monocytes Relative: 7.7 % (ref 3.0–12.0)
NEUTROS ABS: 2.8 10*3/uL (ref 1.4–7.7)
Neutrophils Relative %: 57.5 % (ref 43.0–77.0)
PLATELETS: 270 10*3/uL (ref 150.0–400.0)
RBC: 4.98 Mil/uL (ref 4.22–5.81)
RDW: 13.3 % (ref 11.5–15.5)
WBC: 4.9 10*3/uL (ref 4.0–10.5)

## 2018-05-19 LAB — LIPID PANEL
CHOL/HDL RATIO: 4
CHOLESTEROL: 159 mg/dL (ref 0–200)
HDL: 44.6 mg/dL (ref >39.00)
LDL Cholesterol: 81 mg/dL (ref 0–99)
NonHDL: 114.18
TRIGLYCERIDES: 168 mg/dL — AB (ref 0.0–149.0)
VLDL: 33.6 mg/dL (ref 0.0–40.0)

## 2018-05-19 LAB — BASIC METABOLIC PANEL
BUN: 18 mg/dL (ref 6–23)
CALCIUM: 9.5 mg/dL (ref 8.4–10.5)
CO2: 27 meq/L (ref 19–32)
CREATININE: 1.04 mg/dL (ref 0.40–1.50)
Chloride: 106 mEq/L (ref 96–112)
GFR: 75.02 mL/min (ref >60.00)
Glucose, Bld: 141 mg/dL — ABNORMAL HIGH (ref 70–99)
Potassium: 4 mEq/L (ref 3.5–5.1)
Sodium: 141 mEq/L (ref 135–145)

## 2018-05-19 LAB — URINALYSIS, ROUTINE W REFLEX MICROSCOPIC
Hgb urine dipstick: NEGATIVE
LEUKOCYTES UA: NEGATIVE
Nitrite: NEGATIVE
PH: 6 (ref 5.0–8.0)
Specific Gravity, Urine: >=1.03 — AB (ref 1.000–1.030)
UROBILINOGEN UA: 1 (ref 0.0–1.0)
Urine Glucose: NEGATIVE

## 2018-05-19 LAB — HEPATIC FUNCTION PANEL
ALBUMIN: 4.4 g/dL (ref 3.5–5.2)
ALT: 22 U/L (ref 0–53)
AST: 16 U/L (ref 0–37)
Alkaline Phosphatase: 46 U/L (ref 39–117)
BILIRUBIN DIRECT: 0.1 mg/dL (ref 0.0–0.3)
TOTAL PROTEIN: 6.4 g/dL (ref 6.0–8.3)
Total Bilirubin: 0.7 mg/dL (ref 0.2–1.2)

## 2018-05-19 LAB — TSH: TSH: 0.9 u[IU]/mL (ref 0.35–4.50)

## 2018-05-19 LAB — HEMOGLOBIN A1C: HEMOGLOBIN A1C: 5.6 % (ref 4.6–6.5)

## 2018-05-19 MED ORDER — LOSARTAN POTASSIUM 50 MG PO TABS: 50.0000 mg | ORAL_TABLET | Freq: Every day | ORAL | 3 refills | Status: DC

## 2018-05-19 NOTE — Assessment & Plan Note (Signed)
Mild uncontrolled, for losartan 50 qd

## 2018-05-19 NOTE — Patient Instructions (Addendum)
You had the Pneumovax pneumonia shot today  Please take all new medication as prescribed - the losartan 50 gm per day  Please check your blood pressure on a regular basis, with the goal being to be less than 130/80.  Please call in 1-2 wks if your BP is higher, as we could increase the losartan  Please continue all other medications as before  Please have the pharmacy call with any other refills you may need.  Please continue your efforts at being more active, low cholesterol diet, and weight control.  You are otherwise up to date with prevention measures today.  Please keep your appointments with your specialists as you may have planned  Please go to the LAB in the Basement (turn left off the elevator) for the tests to be done today  You will be contacted by phone if any changes need to be made immediately.  Otherwise, you will receive a letter about your results with an explanation, but please check with MyChart first.  Please remember to sign up for MyChart if you have not done so, as this will be important to you in the future with finding out test results, communicating by private email, and scheduling acute appointments online when needed.  Please return in 6 months, or sooner if needed

## 2018-05-19 NOTE — Assessment & Plan Note (Signed)

## 2018-05-19 NOTE — Progress Notes (Signed)
Subjective:    Patient ID: Maxwell Hinton, male    DOB: 29-Feb-1946, 72 y.o.   MRN: [id number]  HPI  Here for wellness and f/u;  Overall doing ok;  Pt denies Chest pain, worsening SOB, DOE, wheezing, orthopnea, PND, worsening LE edema, palpitations, dizziness or syncope.  Pt denies neurological change such as new headache, facial or extremity weakness.  Pt denies polydipsia, polyuria, or low sugar symptoms. Pt states overall good compliance with treatment and medications, good tolerability, and has been trying to follow appropriate diet.  Pt denies worsening depressive symptoms, suicidal ideation or panic. No fever, night sweats, wt loss, loss of appetite, or other constitutional symptoms.  Pt states good ability with ADL's, has low fall risk, home safety reviewed and adequate, no other significant changes in hearing or vision, and only occasionally active with exercise.  No new complaints Past Medical History:  Diagnosis Date  . GERD (gastroesophageal reflux disease)   . Gilbert's syndrome 2010  . Other abnormal glucose 2010  . Skin cancer    Past Surgical History:  Procedure Laterality Date  . colonoscopy with polypectomy     X2 ; hyperplastic  . TONSILLECTOMY AND ADENOIDECTOMY    . TOTAL HIP ARTHROPLASTY      reports that he has been smoking cigars.  He has never used smokeless tobacco. He reports that he drinks about 1.2 oz of alcohol per week. He reports that he does not use drugs. family history includes Diabetes in his mother; Lung cancer in his brother. No Known Allergies Current Outpatient Medications on File Prior to Visit  Medication Sig Dispense Refill  . aspirin EC 81 MG tablet Take 2 tablets (162 mg total) by mouth daily. 90 tablet 11   No current facility-administered medications on file prior to visit.   BP at BP screening at a crafts fair - 178/106.  Gained about 10 lbs recently.  Does c/o ongoing fatigue, but denies signficant daytime hypersomnolence. Wt Readings  from Last 3 Encounters:  05/19/18 273 lb (123.8 kg)  05/21/17 264 lb (119.7 kg)  05/13/17 264 lb (119.7 kg)   Past Medical History:  Diagnosis Date  . GERD (gastroesophageal reflux disease)   . Gilbert's syndrome 2010  . Other abnormal glucose 2010  . Skin cancer    Past Surgical History:  Procedure Laterality Date  . colonoscopy with polypectomy     X2 ; hyperplastic  . TONSILLECTOMY AND ADENOIDECTOMY    . TOTAL HIP ARTHROPLASTY      reports that he has been smoking cigars.  He has never used smokeless tobacco. He reports that he drinks about 1.2 oz of alcohol per week. He reports that he does not use drugs. family history includes Diabetes in his mother; Lung cancer in his brother. No Known Allergies Current Outpatient Medications on File Prior to Visit  Medication Sig Dispense Refill  . aspirin EC 81 MG tablet Take 2 tablets (162 mg total) by mouth daily. 90 tablet 11   No current facility-administered medications on file prior to visit.    Review of Systems Constitutional: Negative for other unusual diaphoresis, sweats, appetite or weight changes HENT: Negative for other worsening hearing loss, ear pain, facial swelling, mouth sores or neck stiffness.   Eyes: Negative for other worsening pain, redness or other visual disturbance.  Respiratory: Negative for other stridor or swelling Cardiovascular: Negative for other palpitations or other chest pain  Gastrointestinal: Negative for worsening diarrhea or loose stools, blood in stool, distention or  other pain Genitourinary: Negative for hematuria, flank pain or other change in urine volume.  Musculoskeletal: Negative for myalgias or other joint swelling.  Skin: Negative for other color change, or other wound or worsening drainage.  Neurological: Negative for other syncope or numbness. Hematological: Negative for other adenopathy or swelling Psychiatric/Behavioral: Negative for hallucinations, other worsening agitation, SI,  self-injury, or new decreased concentration All other system neg per pt    Objective:   Physical Exam BP (!) 144/88   Pulse 84   Temp 97.7 F (36.5 C) (Oral)   Ht 6\' 7"  (2.007 m)   Wt 273 lb (123.8 kg)   SpO2 98%   BMI 30.75 kg/m  VS noted,  Constitutional: Pt is oriented to person, place, and time. Appears well-developed and well-nourished, in no significant distress and comfortable Head: Normocephalic and atraumatic  Eyes: Conjunctivae and EOM are normal. Pupils are equal, round, and reactive to light Right Ear: External ear normal without discharge Left Ear: External ear normal without discharge Nose: Nose without discharge or deformity Mouth/Throat: Oropharynx is without other ulcerations and moist  Neck: Normal range of motion. Neck supple. No JVD present. No tracheal deviation present or significant neck LA or mass Cardiovascular: Normal rate, regular rhythm, normal heart sounds and intact distal pulses.   Pulmonary/Chest: WOB normal and breath sounds without rales or wheezing  Abdominal: Soft. Bowel sounds are normal. NT. No HSM  Musculoskeletal: Normal range of motion. Exhibits no edema Lymphadenopathy: Has no other cervical adenopathy.  Neurological: Pt is alert and oriented to person, place, and time. Pt has normal reflexes. No cranial nerve deficit. Motor grossly intact, Gait intact Skin: Skin is warm and dry. No rash noted or new ulcerations Psychiatric:  Has normal mood and affect. Behavior is normal without agitation No other exam findings Lab Results  Component Value Date   WBC 4.9 05/19/2018   HGB 15.4 05/19/2018   HCT 43.6 05/19/2018   PLT 270.0 05/19/2018   GLUCOSE 141 (H) 05/19/2018   CHOL 159 05/19/2018   TRIG 168.0 (H) 05/19/2018   HDL 44.60 05/19/2018   LDLCALC 81 05/19/2018   ALT 22 05/19/2018   AST 16 05/19/2018   NA 141 05/19/2018   K 4.0 05/19/2018   CL 106 05/19/2018   CREATININE 1.04 05/19/2018   BUN 18 05/19/2018   CO2 27 05/19/2018    TSH 0.90 05/19/2018   PSA 1.02 05/19/2018   HGBA1C 5.6 05/19/2018       Assessment & Plan:

## 2018-05-19 NOTE — Assessment & Plan Note (Signed)
stable overall by history and exam, recent data reviewed with pt, and pt to continue medical treatment as before,  to f/u any worsening symptoms or concerns Lab Results  Component Value Date   HGBA1C 5.6 05/19/2018

## 2018-05-20 LAB — HEPATITIS C ANTIBODY
HEP C AB: NONREACTIVE
SIGNAL TO CUT-OFF: 0.07 (ref <1.00)

## 2018-06-17 DIAGNOSIS — H5213 Myopia, bilateral: Secondary | ICD-10-CM | POA: Diagnosis not present

## 2018-08-11 DIAGNOSIS — L570 Actinic keratosis: Secondary | ICD-10-CM | POA: Diagnosis not present

## 2018-08-11 DIAGNOSIS — L219 Seborrheic dermatitis, unspecified: Secondary | ICD-10-CM | POA: Diagnosis not present

## 2018-08-11 DIAGNOSIS — C4441 Basal cell carcinoma of skin of scalp and neck: Secondary | ICD-10-CM | POA: Diagnosis not present

## 2018-08-11 DIAGNOSIS — D485 Neoplasm of uncertain behavior of skin: Secondary | ICD-10-CM | POA: Diagnosis not present

## 2018-08-11 DIAGNOSIS — I10 Essential (primary) hypertension: Secondary | ICD-10-CM | POA: Diagnosis not present

## 2018-09-02 DIAGNOSIS — C4441 Basal cell carcinoma of skin of scalp and neck: Secondary | ICD-10-CM | POA: Diagnosis not present

## 2018-11-24 ENCOUNTER — Ambulatory Visit (INDEPENDENT_AMBULATORY_CARE_PROVIDER_SITE_OTHER): Payer: Medicare Other | Admitting: Internal Medicine

## 2018-11-24 ENCOUNTER — Encounter: Payer: Self-pay | Admitting: Internal Medicine

## 2018-11-24 VITALS — BP 136/84 | HR 76 | Temp 97.6°F | Ht 79.0 in | Wt 277.0 lb

## 2018-11-24 DIAGNOSIS — K219 Gastro-esophageal reflux disease without esophagitis: Secondary | ICD-10-CM | POA: Diagnosis not present

## 2018-11-24 DIAGNOSIS — R739 Hyperglycemia, unspecified: Secondary | ICD-10-CM

## 2018-11-24 DIAGNOSIS — I10 Essential (primary) hypertension: Secondary | ICD-10-CM | POA: Diagnosis not present

## 2018-11-24 DIAGNOSIS — Z0000 Encounter for general adult medical examination without abnormal findings: Secondary | ICD-10-CM

## 2018-11-24 LAB — POCT GLYCOSYLATED HEMOGLOBIN (HGB A1C): Hemoglobin A1C: 5.2 % (ref 4.0–5.6)

## 2018-11-24 NOTE — Assessment & Plan Note (Signed)
stable overall by history and exam, recent data reviewed with pt, and pt to continue medical treatment as before,  to f/u any worsening symptoms or concerns  

## 2018-11-24 NOTE — Progress Notes (Addendum)
Subjective:    Patient ID: Maxwell Hinton, male    DOB: 1946-04-19, 73 y.o.   MRN: [id number]  HPI  Here to f/u; overall doing ok,  Pt denies chest pain, increasing sob or doe, wheezing, orthopnea, PND, increased LE swelling, palpitations, dizziness or syncope.  Pt denies new neurological symptoms such as new headache, or facial or extremity weakness or numbness.  Pt denies polydipsia, polyuria, or low sugar episode.  Pt states overall good compliance with meds, mostly trying to follow appropriate diet, with wt overall stable,  but little exercise however. Gained 13 lbs with too much fast food, almost retired.  Denies worsening reflux, abd pain, dysphagia, n/v, bowel change or blood. Wt Readings from Last 3 Encounters:  11/24/18 277 lb (125.6 kg)  05/19/18 273 lb (123.8 kg)  05/21/17 264 lb (119.7 kg)   BP Readings from Last 3 Encounters:  11/24/18 136/84  05/19/18 (!) 144/88  05/21/17 140/88   Past Medical History:  Diagnosis Date  . GERD (gastroesophageal reflux disease)   . Gilbert's syndrome 2010  . Other abnormal glucose 2010  . Skin cancer    Past Surgical History:  Procedure Laterality Date  . colonoscopy with polypectomy     X2 ; hyperplastic  . TONSILLECTOMY AND ADENOIDECTOMY    . TOTAL HIP ARTHROPLASTY      reports that he has been smoking cigars. He has never used smokeless tobacco. He reports current alcohol use of about 2.0 standard drinks of alcohol per week. He reports that he does not use drugs. family history includes Diabetes in his mother; Lung cancer in his brother. No Known Allergies Current Outpatient Medications on File Prior to Visit  Medication Sig Dispense Refill  . aspirin EC 81 MG tablet Take 2 tablets (162 mg total) by mouth daily. 90 tablet 11  . losartan (COZAAR) 50 MG tablet Take 1 tablet (50 mg total) by mouth daily. 90 tablet 3   No current facility-administered medications on file prior to visit.    Review of Systems  Constitutional:  Negative for other unusual diaphoresis or sweats HENT: Negative for ear discharge or swelling Eyes: Negative for other worsening visual disturbances Respiratory: Negative for stridor or other swelling  Gastrointestinal: Negative for worsening distension or other blood Genitourinary: Negative for retention or other urinary change Musculoskeletal: Negative for other MSK pain or swelling Skin: Negative for color change or other new lesions Neurological: Negative for worsening tremors and other numbness  Psychiatric/Behavioral: Negative for worsening agitation or other fatigue All other system neg per pt    Objective:   Physical Exam BP 136/84   Pulse 76   Temp 97.6 F (36.4 C) (Oral)   Ht 6\' 7"  (2.007 m)   Wt 277 lb (125.6 kg)   SpO2 96%   BMI 31.21 kg/m  VS noted,  Constitutional: Pt appears in NAD HENT: Head: NCAT.  Right Ear: External ear normal.  Left Ear: External ear normal.  Eyes: . Pupils are equal, round, and reactive to light. Conjunctivae and EOM are normal Nose: without d/c or deformity Neck: Neck supple. Gross normal ROM Cardiovascular: Normal rate and regular rhythm.   Pulmonary/Chest: Effort normal and breath sounds without rales or wheezing.  Abd:  Soft, NT, ND, + BS, no organomegaly Neurological: Pt is alert. At baseline orientation, motor grossly intact Skin: Skin is warm. No rashes, other new lesions, no LE edema Psychiatric: Pt behavior is normal without agitation  No other exam findings Lab Results  Component  Value Date   WBC 4.9 05/19/2018   HGB 15.4 05/19/2018   HCT 43.6 05/19/2018   PLT 270.0 05/19/2018   GLUCOSE 141 (H) 05/19/2018   CHOL 159 05/19/2018   TRIG 168.0 (H) 05/19/2018   HDL 44.60 05/19/2018   LDLCALC 81 05/19/2018   ALT 22 05/19/2018   AST 16 05/19/2018   NA 141 05/19/2018   K 4.0 05/19/2018   CL 106 05/19/2018   CREATININE 1.04 05/19/2018   BUN 18 05/19/2018   CO2 27 05/19/2018   TSH 0.90 05/19/2018   PSA 1.02 05/19/2018    HGBA1C 5.2 11/24/2018  POCT glycosylated hemoglobin (Hb A1C)  Order: [id number]  Status:  Final result Visible to patient:  No (Not Released) Dx:  Hyperglycemia   Ref Range & Units 1d ago 82mo ago 18yr ago  Hemoglobin A1C 4.0 - 5.6 % 5.2  5.6 R, CM 5.3 R           Assessment & Plan:

## 2018-11-24 NOTE — Patient Instructions (Addendum)
Your A1c test was OK today  Please continue all other medications as before, and refills have been done if requested.  Please have the pharmacy call with any other refills you may need.  Please continue your efforts at being more active, low cholesterol diet, and weight control.  You are otherwise up to date with prevention measures today.  Please keep your appointments with your specialists as you may have planned  Please return in 6 months, or sooner if needed, with Lab testing done 3-5 days before

## 2019-04-26 ENCOUNTER — Encounter: Payer: Self-pay | Admitting: Internal Medicine

## 2019-04-26 ENCOUNTER — Telehealth: Payer: Self-pay | Admitting: *Deleted

## 2019-04-26 DIAGNOSIS — Z20822 Contact with and (suspected) exposure to covid-19: Secondary | ICD-10-CM

## 2019-04-26 NOTE — Telephone Encounter (Signed)
Spoke with patient, per his request, scheduled him for COVID 19 test 04/29/2019 at 10 am at [facility].  Testing protocol reviewed.

## 2019-04-26 NOTE — Telephone Encounter (Signed)
-----   Message from Biagio Borg, MD sent at 04/26/2019 12:26 PM EDT ----- Regarding: covid testing Please to contact pt for COVID tesitng due to possible exposure and need for family gathering soon

## 2019-04-29 ENCOUNTER — Other Ambulatory Visit: Payer: Medicare Other

## 2019-04-29 ENCOUNTER — Other Ambulatory Visit: Payer: Self-pay | Admitting: Internal Medicine

## 2019-04-29 DIAGNOSIS — Z20822 Contact with and (suspected) exposure to covid-19: Secondary | ICD-10-CM

## 2019-04-29 DIAGNOSIS — R6889 Other general symptoms and signs: Secondary | ICD-10-CM | POA: Diagnosis not present

## 2019-05-04 ENCOUNTER — Telehealth: Payer: Self-pay

## 2019-05-04 LAB — NOVEL CORONAVIRUS, NAA: SARS-CoV-2, NAA: NOT DETECTED

## 2019-05-04 NOTE — Telephone Encounter (Signed)
-----   Message from Biagio Borg, MD sent at 05/04/2019 12:12 PM EDT ----- See below

## 2019-05-04 NOTE — Telephone Encounter (Signed)
Pt has viewed results via MyChart  

## 2019-05-20 ENCOUNTER — Other Ambulatory Visit: Payer: Self-pay | Admitting: Internal Medicine

## 2019-05-25 ENCOUNTER — Other Ambulatory Visit: Payer: Self-pay | Admitting: Internal Medicine

## 2019-05-25 ENCOUNTER — Other Ambulatory Visit: Payer: Self-pay

## 2019-05-25 ENCOUNTER — Ambulatory Visit (INDEPENDENT_AMBULATORY_CARE_PROVIDER_SITE_OTHER): Payer: Medicare Other | Admitting: Internal Medicine

## 2019-05-25 ENCOUNTER — Encounter: Payer: Self-pay | Admitting: Internal Medicine

## 2019-05-25 ENCOUNTER — Other Ambulatory Visit (INDEPENDENT_AMBULATORY_CARE_PROVIDER_SITE_OTHER): Payer: Medicare Other

## 2019-05-25 VITALS — BP 126/76 | HR 81 | Temp 98.0°F | Ht 79.0 in | Wt 273.0 lb

## 2019-05-25 DIAGNOSIS — E611 Iron deficiency: Secondary | ICD-10-CM | POA: Diagnosis not present

## 2019-05-25 DIAGNOSIS — R739 Hyperglycemia, unspecified: Secondary | ICD-10-CM | POA: Diagnosis not present

## 2019-05-25 DIAGNOSIS — Z125 Encounter for screening for malignant neoplasm of prostate: Secondary | ICD-10-CM | POA: Diagnosis not present

## 2019-05-25 DIAGNOSIS — E538 Deficiency of other specified B group vitamins: Secondary | ICD-10-CM

## 2019-05-25 DIAGNOSIS — E559 Vitamin D deficiency, unspecified: Secondary | ICD-10-CM

## 2019-05-25 DIAGNOSIS — Z0000 Encounter for general adult medical examination without abnormal findings: Secondary | ICD-10-CM

## 2019-05-25 LAB — CBC WITH DIFFERENTIAL/PLATELET
Basophils Absolute: 0.1 10*3/uL (ref 0.0–0.1)
Basophils Relative: 0.9 % (ref 0.0–3.0)
Eosinophils Absolute: 0.1 10*3/uL (ref 0.0–0.7)
Eosinophils Relative: 1.7 % (ref 0.0–5.0)
HCT: 45 % (ref 39.0–52.0)
Hemoglobin: 15.4 g/dL (ref 13.0–17.0)
Lymphocytes Relative: 33.4 % (ref 12.0–46.0)
Lymphs Abs: 2 10*3/uL (ref 0.7–4.0)
MCHC: 34.3 g/dL (ref 30.0–36.0)
MCV: 88.1 fl (ref 78.0–100.0)
Monocytes Absolute: 0.5 10*3/uL (ref 0.1–1.0)
Monocytes Relative: 7.5 % (ref 3.0–12.0)
Neutro Abs: 3.4 10*3/uL (ref 1.4–7.7)
Neutrophils Relative %: 56.5 % (ref 43.0–77.0)
Platelets: 269 10*3/uL (ref 150.0–400.0)
RBC: 5.1 Mil/uL (ref 4.22–5.81)
RDW: 13.2 % (ref 11.5–15.5)
WBC: 6 10*3/uL (ref 4.0–10.5)

## 2019-05-25 LAB — LIPID PANEL
Cholesterol: 177 mg/dL (ref 0–200)
HDL: 46.3 mg/dL (ref >39.00)
LDL Cholesterol: 104 mg/dL — ABNORMAL HIGH (ref 0–99)
NonHDL: 131
Total CHOL/HDL Ratio: 4
Triglycerides: 134 mg/dL (ref 0.0–149.0)
VLDL: 26.8 mg/dL (ref 0.0–40.0)

## 2019-05-25 LAB — URINALYSIS, ROUTINE W REFLEX MICROSCOPIC
Bilirubin Urine: NEGATIVE
Hgb urine dipstick: NEGATIVE
Ketones, ur: NEGATIVE
Leukocytes,Ua: NEGATIVE
Nitrite: NEGATIVE
RBC / HPF: NONE SEEN (ref 0–?)
Specific Gravity, Urine: 1.025 (ref 1.000–1.030)
Total Protein, Urine: NEGATIVE
Urine Glucose: NEGATIVE
Urobilinogen, UA: 0.2 (ref 0.0–1.0)
pH: 6 (ref 5.0–8.0)

## 2019-05-25 LAB — BASIC METABOLIC PANEL
BUN: 17 mg/dL (ref 6–23)
CO2: 28 mEq/L (ref 19–32)
Calcium: 9.8 mg/dL (ref 8.4–10.5)
Chloride: 104 mEq/L (ref 96–112)
Creatinine, Ser: 1.01 mg/dL (ref 0.40–1.50)
GFR: 72.8 mL/min (ref >60.00)
Glucose, Bld: 88 mg/dL (ref 70–99)
Potassium: 4.3 mEq/L (ref 3.5–5.1)
Sodium: 139 mEq/L (ref 135–145)

## 2019-05-25 LAB — HEPATIC FUNCTION PANEL
ALT: 20 U/L (ref 0–53)
AST: 18 U/L (ref 0–37)
Albumin: 4.7 g/dL (ref 3.5–5.2)
Alkaline Phosphatase: 50 U/L (ref 39–117)
Bilirubin, Direct: 0.1 mg/dL (ref 0.0–0.3)
Total Bilirubin: 0.7 mg/dL (ref 0.2–1.2)
Total Protein: 6.8 g/dL (ref 6.0–8.3)

## 2019-05-25 LAB — IBC PANEL
Iron: 114 ug/dL (ref 42–165)
Saturation Ratios: 34.1 % (ref 20.0–50.0)
Transferrin: 239 mg/dL (ref 212.0–360.0)

## 2019-05-25 LAB — VITAMIN D 25 HYDROXY (VIT D DEFICIENCY, FRACTURES): VITD: 26.52 ng/mL — ABNORMAL LOW (ref 30.00–100.00)

## 2019-05-25 LAB — PSA: PSA: 0.95 ng/mL (ref 0.10–4.00)

## 2019-05-25 LAB — TSH: TSH: 1.37 u[IU]/mL (ref 0.35–4.50)

## 2019-05-25 LAB — VITAMIN B12: Vitamin B-12: 266 pg/mL (ref 211–911)

## 2019-05-25 LAB — HEMOGLOBIN A1C: Hgb A1c MFr Bld: 5.8 % (ref 4.6–6.5)

## 2019-05-25 MED ORDER — VITAMIN D (ERGOCALCIFEROL) 1.25 MG (50000 UNIT) PO CAPS: 50000.0000 [IU] | ORAL_CAPSULE | ORAL | 0 refills | Status: DC

## 2019-05-25 NOTE — Patient Instructions (Signed)

## 2019-05-25 NOTE — Assessment & Plan Note (Signed)

## 2019-05-25 NOTE — Progress Notes (Signed)
Subjective:    Patient ID: Maxwell Hinton, male    DOB: Jul 18, 1946, 73 y.o.   MRN: [id number]  HPI   Here for wellness and f/u;  Overall doing ok;  Pt denies Chest pain, worsening SOB, DOE, wheezing, orthopnea, PND, worsening LE edema, palpitations, dizziness or syncope.  Pt denies neurological change such as new headache, facial or extremity weakness.  Pt denies polydipsia, polyuria, or low sugar symptoms. Pt states overall good compliance with treatment and medications, good tolerability, and has been trying to follow appropriate diet.  Pt denies worsening depressive symptoms, suicidal ideation or panic. No fever, night sweats, wt loss, loss of appetite, or other constitutional symptoms.  Pt states good ability with ADL's, has low fall risk, home safety reviewed and adequate, no other significant changes in hearing or vision, and only occasionally active with exercise.  No new complaints Wt Readings from Last 3 Encounters:  05/25/19 273 lb (123.8 kg)  11/24/18 277 lb (125.6 kg)  05/19/18 273 lb (123.8 kg)   Past Medical History:  Diagnosis Date  . GERD (gastroesophageal reflux disease)   . Gilbert's syndrome 2010  . Other abnormal glucose 2010  . Skin cancer    Past Surgical History:  Procedure Laterality Date  . colonoscopy with polypectomy     X2 ; hyperplastic  . TONSILLECTOMY AND ADENOIDECTOMY    . TOTAL HIP ARTHROPLASTY      reports that he has been smoking cigars. He has never used smokeless tobacco. He reports current alcohol use of about 2.0 standard drinks of alcohol per week. He reports that he does not use drugs. family history includes Diabetes in his mother; Lung cancer in his brother. No Known Allergies Current Outpatient Medications on File Prior to Visit  Medication Sig Dispense Refill  . aspirin EC 81 MG tablet Take 2 tablets (162 mg total) by mouth daily. 90 tablet 11  . losartan (COZAAR) 50 MG tablet TAKE 1 TABLET BY MOUTH DAILY. 90 tablet 3   No current  facility-administered medications on file prior to visit.    Review of Systems Constitutional: Negative for other unusual diaphoresis, sweats, appetite or weight changes HENT: Negative for other worsening hearing loss, ear pain, facial swelling, mouth sores or neck stiffness.   Eyes: Negative for other worsening pain, redness or other visual disturbance.  Respiratory: Negative for other stridor or swelling Cardiovascular: Negative for other palpitations or other chest pain  Gastrointestinal: Negative for worsening diarrhea or loose stools, blood in stool, distention or other pain Genitourinary: Negative for hematuria, flank pain or other change in urine volume.  Musculoskeletal: Negative for myalgias or other joint swelling.  Skin: Negative for other color change, or other wound or worsening drainage.  Neurological: Negative for other syncope or numbness. Hematological: Negative for other adenopathy or swelling Psychiatric/Behavioral: Negative for hallucinations, other worsening agitation, SI, self-injury, or new decreased concentration All other system neg per pt    Objective:   Physical Exam BP 126/76   Pulse 81   Temp 98 F (36.7 C) (Oral)   Ht 6\' 7"  (2.007 m)   Wt 273 lb (123.8 kg)   SpO2 96%   BMI 30.75 kg/m  VS noted,  Constitutional: Pt is oriented to person, place, and time. Appears well-developed and well-nourished, in no significant distress and comfortable Head: Normocephalic and atraumatic  Eyes: Conjunctivae and EOM are normal. Pupils are equal, round, and reactive to light Right Ear: External ear normal without discharge Left Ear: External ear normal  without discharge Nose: Nose without discharge or deformity Mouth/Throat: Oropharynx is without other ulcerations and moist  Neck: Normal range of motion. Neck supple. No JVD present. No tracheal deviation present or significant neck LA or mass Cardiovascular: Normal rate, regular rhythm, normal heart sounds and intact  distal pulses.   Pulmonary/Chest: WOB normal and breath sounds without rales or wheezing  Abdominal: Soft. Bowel sounds are normal. NT. No HSM  Musculoskeletal: Normal range of motion. Exhibits no edema Lymphadenopathy: Has no other cervical adenopathy.  Neurological: Pt is alert and oriented to person, place, and time. Pt has normal reflexes. No cranial nerve deficit. Motor grossly intact, Gait intact Skin: Skin is warm and dry. No rash noted or new ulcerations Psychiatric:  Has normal mood and affect. Behavior is normal without agitation No other exam findings Lab Results  Component Value Date   WBC 6.0 05/25/2019   HGB 15.4 05/25/2019   HCT 45.0 05/25/2019   PLT 269.0 05/25/2019   GLUCOSE 88 05/25/2019   CHOL 177 05/25/2019   TRIG 134.0 05/25/2019   HDL 46.30 05/25/2019   LDLCALC 104 (H) 05/25/2019   ALT 20 05/25/2019   AST 18 05/25/2019   NA 139 05/25/2019   K 4.3 05/25/2019   CL 104 05/25/2019   CREATININE 1.01 05/25/2019   BUN 17 05/25/2019   CO2 28 05/25/2019   TSH 1.37 05/25/2019   PSA 0.95 05/25/2019   HGBA1C 5.8 05/25/2019       Assessment & Plan:

## 2019-05-25 NOTE — Telephone Encounter (Signed)
Done erx 

## 2019-05-25 NOTE — Assessment & Plan Note (Signed)
stable overall by history and exam, recent data reviewed with pt, and pt to continue medical treatment as before,  to f/u any worsening symptoms or concerns  

## 2019-07-18 ENCOUNTER — Ambulatory Visit (INDEPENDENT_AMBULATORY_CARE_PROVIDER_SITE_OTHER): Payer: Medicare Other

## 2019-07-18 ENCOUNTER — Other Ambulatory Visit: Payer: Self-pay

## 2019-07-18 DIAGNOSIS — Z23 Encounter for immunization: Secondary | ICD-10-CM | POA: Diagnosis not present

## 2019-08-03 DIAGNOSIS — Z20828 Contact with and (suspected) exposure to other viral communicable diseases: Secondary | ICD-10-CM | POA: Diagnosis not present

## 2019-09-06 ENCOUNTER — Encounter: Payer: Self-pay | Admitting: Internal Medicine

## 2019-09-06 ENCOUNTER — Ambulatory Visit (INDEPENDENT_AMBULATORY_CARE_PROVIDER_SITE_OTHER): Payer: Medicare Other | Admitting: Internal Medicine

## 2019-09-06 ENCOUNTER — Other Ambulatory Visit: Payer: Self-pay

## 2019-09-06 VITALS — BP 126/82 | HR 79 | Temp 97.6°F | Ht 79.0 in | Wt 276.0 lb

## 2019-09-06 DIAGNOSIS — L02411 Cutaneous abscess of right axilla: Secondary | ICD-10-CM

## 2019-09-06 DIAGNOSIS — R739 Hyperglycemia, unspecified: Secondary | ICD-10-CM

## 2019-09-06 DIAGNOSIS — I10 Essential (primary) hypertension: Secondary | ICD-10-CM

## 2019-09-06 MED ORDER — DOXYCYCLINE HYCLATE 100 MG PO TABS: 100.0000 mg | ORAL_TABLET | Freq: Two times a day (BID) | ORAL | 0 refills | Status: DC

## 2019-09-06 MED ORDER — CEFTRIAXONE SODIUM 1 G IJ SOLR
1.0000 g | Freq: Once | INTRAMUSCULAR | Status: AC
  Administered 2019-09-06: 16:00:00 1 g via INTRAMUSCULAR

## 2019-09-06 NOTE — Progress Notes (Signed)
Subjective:    Patient ID: Maxwell Hinton, male    DOB: Mar 27, 1946, 73 y.o.   MRN: [id number]  HPI  Here with c/o 3 days onset area of the right axilla red, tender swelling without fever or drainage; Pt denies chest pain, increasing sob or doe, wheezing, orthopnea, PND, increased LE swelling, palpitations, dizziness or syncope.  Pt denies new neurological symptoms such as new headache, or facial or extremity weakness or numbness.  Pt denies polydipsia, polyuria  No other new complaints Past Medical History:  Diagnosis Date  . GERD (gastroesophageal reflux disease)   . Gilbert's syndrome 2010  . Other abnormal glucose 2010  . Skin cancer    Past Surgical History:  Procedure Laterality Date  . colonoscopy with polypectomy     X2 ; hyperplastic  . TONSILLECTOMY AND ADENOIDECTOMY    . TOTAL HIP ARTHROPLASTY      reports that he has been smoking cigars. He has never used smokeless tobacco. He reports current alcohol use of about 2.0 standard drinks of alcohol per week. He reports that he does not use drugs. family history includes Diabetes in his mother; Lung cancer in his brother. No Known Allergies Current Outpatient Medications on File Prior to Visit  Medication Sig Dispense Refill  . aspirin EC 81 MG tablet Take 2 tablets (162 mg total) by mouth daily. 90 tablet 11  . losartan (COZAAR) 50 MG tablet TAKE 1 TABLET BY MOUTH DAILY. 90 tablet 3  . Vitamin D, Ergocalciferol, (DRISDOL) 1.25 MG (50000 UT) CAPS capsule Take 1 capsule (50,000 Units total) by mouth every 7 (seven) days. 12 capsule 0   No current facility-administered medications on file prior to visit.    Review of Systems  Constitutional: Negative for other unusual diaphoresis or sweats HENT: Negative for ear discharge or swelling Eyes: Negative for other worsening visual disturbances Respiratory: Negative for stridor or other swelling  Gastrointestinal: Negative for worsening distension or other blood Genitourinary:  Negative for retention or other urinary change Musculoskeletal: Negative for other MSK pain or swelling Skin: Negative for color change or other new lesions Neurological: Negative for worsening tremors and other numbness  Psychiatric/Behavioral: Negative for worsening agitation or other fatigue All otherwise neg per pt     Objective:   Physical Exam BP 126/82   Pulse 79   Temp 97.6 F (36.4 C) (Oral)   Ht 6\' 7"  (2.007 m)   Wt 276 lb (125.2 kg)   SpO2 98%   BMI 31.09 kg/m  VS noted,  Constitutional: Pt appears in NAD HENT: Head: NCAT.  Right Ear: External ear normal.  Left Ear: External ear normal.  Eyes: . Pupils are equal, round, and reactive to light. Conjunctivae and EOM are normal Nose: without d/c or deformity Neck: Neck supple. Gross normal ROM Cardiovascular: Normal rate and regular rhythm.   Pulmonary/Chest: Effort normal and breath sounds without rales or wheezing.  Abd:  Soft, NT, ND, + BS, no organomegaly Right axilla with 3 cm area raised red, tender swelling fluctuance without drainage Neurological: Pt is alert. At baseline orientation, motor grossly intact Skin: Skin is warm. No rashes, other new lesions, no LE edema Psychiatric: Pt behavior is normal without agitation  All otherwise neg per pt Lab Results  Component Value Date   WBC 6.0 05/25/2019   HGB 15.4 05/25/2019   HCT 45.0 05/25/2019   PLT 269.0 05/25/2019   GLUCOSE 88 05/25/2019   CHOL 177 05/25/2019   TRIG 134.0 05/25/2019  HDL 46.30 05/25/2019   LDLCALC 104 (H) 05/25/2019   ALT 20 05/25/2019   AST 18 05/25/2019   NA 139 05/25/2019   K 4.3 05/25/2019   CL 104 05/25/2019   CREATININE 1.01 05/25/2019   BUN 17 05/25/2019   CO2 28 05/25/2019   TSH 1.37 05/25/2019   PSA 0.95 05/25/2019   HGBA1C 5.8 05/25/2019         Assessment & Plan:

## 2019-09-06 NOTE — Patient Instructions (Signed)
You had the antibiotic shot today (rocephin)  Please take all new medication as prescribed - the antibiotic  Please continue all other medications as before, and refills have been done if requested.  Please have the pharmacy call with any other refills you may need.  Please continue your efforts at being more active, low cholesterol diet, and weight control.  Please keep your appointments with your specialists as you may have planned

## 2019-09-10 ENCOUNTER — Encounter: Payer: Self-pay | Admitting: Internal Medicine

## 2019-09-10 NOTE — Assessment & Plan Note (Signed)
Moderate, likely to need I&D, for antibx, refer general surgury

## 2019-09-10 NOTE — Assessment & Plan Note (Signed)
stable overall by history and exam, recent data reviewed with pt, and pt to continue medical treatment as before,  to f/u any worsening symptoms or concerns  

## 2019-09-19 ENCOUNTER — Other Ambulatory Visit: Payer: Self-pay

## 2019-09-19 DIAGNOSIS — Z20822 Contact with and (suspected) exposure to covid-19: Secondary | ICD-10-CM

## 2019-09-22 LAB — NOVEL CORONAVIRUS, NAA: SARS-CoV-2, NAA: NOT DETECTED

## 2019-11-13 ENCOUNTER — Ambulatory Visit: Payer: Medicare Other

## 2019-11-19 ENCOUNTER — Ambulatory Visit: Payer: Medicare Other | Attending: Internal Medicine

## 2019-11-19 DIAGNOSIS — Z23 Encounter for immunization: Secondary | ICD-10-CM | POA: Insufficient documentation

## 2019-11-19 NOTE — Progress Notes (Signed)
   Covid-19 Vaccination Clinic  Name:  Maxwell Hinton    MRN: [id number] DOB: 20-Mar-1946  11/19/2019  Mr. Maxwell Hinton was observed post Covid-19 immunization for 15 minutes without incidence. He was provided with Vaccine Information Sheet and instruction to access the V-Safe system.   Maxwell Hinton was instructed to call 911 with any severe reactions post vaccine: Maxwell Hinton Kitchen Difficulty breathing  . Swelling of your face and throat  . A fast heartbeat  . A bad rash all over your body  . Dizziness and weakness    Immunizations Administered    Name Date Dose VIS Date Route   Pfizer COVID-19 Vaccine 11/19/2019  5:48 PM 0.3 mL 09/23/2019 Intramuscular   Manufacturer: [place]   Lot: [id number]   [place]: [id number]

## 2019-11-24 ENCOUNTER — Ambulatory Visit: Payer: Medicare Other

## 2019-12-14 ENCOUNTER — Ambulatory Visit: Payer: Medicare Other | Attending: Internal Medicine

## 2019-12-14 DIAGNOSIS — Z23 Encounter for immunization: Secondary | ICD-10-CM | POA: Insufficient documentation

## 2019-12-14 NOTE — Progress Notes (Signed)
   Covid-19 Vaccination Clinic  Name:  Maxwell Hinton    MRN: [id number] DOB: 03/25/1946  12/14/2019  [name] was observed post Covid-19 immunization for 15 minutes without incident. He was provided with Vaccine Information Sheet and instruction to access the V-Safe system.   [name] was instructed to call 911 with any severe reactions post vaccine: Mar[place] Kitchen Difficulty breathing  . Swelling of face and throat  . A fast heartbeat  . A bad rash all over body  . Dizziness and weakness   Immunizations Administered    Name Date Dose VIS Date Route   Pfizer COVID-19 Vaccine 12/14/2019  3:07 PM 0.3 mL 09/23/2019 Intramuscular   Manufacturer: [place]   Lot: [id number]   [place]: [id number]

## 2020-02-15 DIAGNOSIS — D3132 Benign neoplasm of left choroid: Secondary | ICD-10-CM | POA: Diagnosis not present

## 2020-03-22 DIAGNOSIS — L821 Other seborrheic keratosis: Secondary | ICD-10-CM | POA: Diagnosis not present

## 2020-03-22 DIAGNOSIS — L570 Actinic keratosis: Secondary | ICD-10-CM | POA: Diagnosis not present

## 2020-03-22 DIAGNOSIS — D1801 Hemangioma of skin and subcutaneous tissue: Secondary | ICD-10-CM | POA: Diagnosis not present

## 2020-04-25 ENCOUNTER — Other Ambulatory Visit: Payer: Self-pay | Admitting: Internal Medicine

## 2020-06-20 ENCOUNTER — Other Ambulatory Visit: Payer: Self-pay

## 2020-06-20 ENCOUNTER — Encounter: Payer: Self-pay | Admitting: Internal Medicine

## 2020-06-20 MED ORDER — LOSARTAN POTASSIUM 50 MG PO TABS: 50.0000 mg | ORAL_TABLET | Freq: Every day | ORAL | 0 refills | Status: DC

## 2020-07-20 ENCOUNTER — Encounter: Payer: Self-pay | Admitting: Internal Medicine

## 2020-07-25 ENCOUNTER — Other Ambulatory Visit: Payer: Self-pay

## 2020-07-25 ENCOUNTER — Encounter: Payer: Self-pay | Admitting: Internal Medicine

## 2020-07-25 ENCOUNTER — Ambulatory Visit (INDEPENDENT_AMBULATORY_CARE_PROVIDER_SITE_OTHER): Payer: Medicare Other | Admitting: Internal Medicine

## 2020-07-25 VITALS — BP 110/68 | HR 75 | Temp 98.3°F | Ht 79.0 in | Wt 258.0 lb

## 2020-07-25 DIAGNOSIS — Z0000 Encounter for general adult medical examination without abnormal findings: Secondary | ICD-10-CM | POA: Diagnosis not present

## 2020-07-25 DIAGNOSIS — R739 Hyperglycemia, unspecified: Secondary | ICD-10-CM

## 2020-07-25 DIAGNOSIS — Z23 Encounter for immunization: Secondary | ICD-10-CM | POA: Diagnosis not present

## 2020-07-25 DIAGNOSIS — E559 Vitamin D deficiency, unspecified: Secondary | ICD-10-CM | POA: Diagnosis not present

## 2020-07-25 DIAGNOSIS — E785 Hyperlipidemia, unspecified: Secondary | ICD-10-CM

## 2020-07-25 LAB — HEPATIC FUNCTION PANEL
ALT: 23 U/L (ref 0–53)
AST: 19 U/L (ref 0–37)
Albumin: 4.7 g/dL (ref 3.5–5.2)
Alkaline Phosphatase: 49 U/L (ref 39–117)
Bilirubin, Direct: 0.2 mg/dL (ref 0.0–0.3)
Total Bilirubin: 0.9 mg/dL (ref 0.2–1.2)
Total Protein: 6.8 g/dL (ref 6.0–8.3)

## 2020-07-25 LAB — CBC WITH DIFFERENTIAL/PLATELET
Basophils Absolute: 0.1 10*3/uL (ref 0.0–0.1)
Basophils Relative: 0.8 % (ref 0.0–3.0)
Eosinophils Absolute: 0.1 10*3/uL (ref 0.0–0.7)
Eosinophils Relative: 1.1 % (ref 0.0–5.0)
HCT: 42.7 % (ref 39.0–52.0)
Hemoglobin: 15 g/dL (ref 13.0–17.0)
Lymphocytes Relative: 28.2 % (ref 12.0–46.0)
Lymphs Abs: 1.8 10*3/uL (ref 0.7–4.0)
MCHC: 35.1 g/dL (ref 30.0–36.0)
MCV: 87.7 fl (ref 78.0–100.0)
Monocytes Absolute: 0.5 10*3/uL (ref 0.1–1.0)
Monocytes Relative: 7.8 % (ref 3.0–12.0)
Neutro Abs: 3.9 10*3/uL (ref 1.4–7.7)
Neutrophils Relative %: 62.1 % (ref 43.0–77.0)
Platelets: 264 10*3/uL (ref 150.0–400.0)
RBC: 4.86 Mil/uL (ref 4.22–5.81)
RDW: 13.3 % (ref 11.5–15.5)
WBC: 6.3 10*3/uL (ref 4.0–10.5)

## 2020-07-25 LAB — BASIC METABOLIC PANEL
BUN: 20 mg/dL (ref 6–23)
CO2: 27 mEq/L (ref 19–32)
Calcium: 9.7 mg/dL (ref 8.4–10.5)
Chloride: 105 mEq/L (ref 96–112)
Creatinine, Ser: 1.03 mg/dL (ref 0.40–1.50)
GFR: 72.1 mL/min (ref >60.00)
Glucose, Bld: 90 mg/dL (ref 70–99)
Potassium: 4.5 mEq/L (ref 3.5–5.1)
Sodium: 140 mEq/L (ref 135–145)

## 2020-07-25 LAB — LIPID PANEL
Cholesterol: 149 mg/dL (ref 0–200)
HDL: 46.9 mg/dL (ref >39.00)
LDL Cholesterol: 85 mg/dL (ref 0–99)
NonHDL: 102.28
Total CHOL/HDL Ratio: 3
Triglycerides: 84 mg/dL (ref 0.0–149.0)
VLDL: 16.8 mg/dL (ref 0.0–40.0)

## 2020-07-25 LAB — VITAMIN D 25 HYDROXY (VIT D DEFICIENCY, FRACTURES): VITD: 56.07 ng/mL (ref 30.00–100.00)

## 2020-07-25 LAB — HEMOGLOBIN A1C: Hgb A1c MFr Bld: 5.7 % (ref 4.6–6.5)

## 2020-07-25 LAB — PSA: PSA: 1.21 ng/mL (ref 0.10–4.00)

## 2020-07-25 LAB — TSH: TSH: 1.08 u[IU]/mL (ref 0.35–4.50)

## 2020-07-25 MED ORDER — LOSARTAN POTASSIUM 50 MG PO TABS
50.0000 mg | ORAL_TABLET | Freq: Every day | ORAL | 3 refills | Status: DC
Start: 2020-07-25 — End: 2021-08-20

## 2020-07-25 NOTE — Progress Notes (Signed)
Subjective:    Patient ID: Maxwell Hinton, male    DOB: [birth year], 74 y.o.   MRN: [id number]  HPI  Here for wellness and f/u;  Overall doing ok;  Pt denies Chest pain, worsening SOB, DOE, wheezing, orthopnea, PND, worsening LE edema, palpitations, dizziness or syncope.  Pt denies neurological change such as new headache, facial or extremity weakness.  Pt denies polydipsia, polyuria, or low sugar symptoms. Pt states overall good compliance with treatment and medications, good tolerability, and has been trying to follow appropriate diet.  Pt denies worsening depressive symptoms, suicidal ideation or panic. No fever, night sweats, wt loss, loss of appetite, or other constitutional symptoms.  Pt states good ability with ADL's, has low fall risk, home safety reviewed and adequate, no other significant changes in hearing or vision, and only occasionally active with exercise.  No new complaints Past Medical History:  Diagnosis Date  . GERD (gastroesophageal reflux disease)   . Gilbert's syndrome 2010  . Other abnormal glucose 2010  . Skin cancer    Past Surgical History:  Procedure Laterality Date  . colonoscopy with polypectomy     X2 ; hyperplastic  . TONSILLECTOMY AND ADENOIDECTOMY    . TOTAL HIP ARTHROPLASTY      reports that he has been smoking cigars. He has never used smokeless tobacco. He reports current alcohol use of about 2.0 standard drinks of alcohol per week. He reports that he does not use drugs. family history includes Diabetes in his mother; Lung cancer in his brother. No Known Allergies Current Outpatient Medications on File Prior to Visit  Medication Sig Dispense Refill  . aspirin EC 81 MG tablet Take 2 tablets (162 mg total) by mouth daily. 90 tablet 11   No current facility-administered medications on file prior to visit.   Review of Systems All otherwise neg per pt     Objective:   Physical Exam BP 110/68 (BP Location: Left Arm, Patient Position: Sitting, Cuff  Size: Large)   Pulse 75   Temp 98.3 F (36.8 C) (Oral)   Ht 6\' 7"  (2.007 m)   Wt 258 lb (117 kg)   SpO2 93%   BMI 29.06 kg/m  VS noted,  Constitutional: Pt appears in NAD HENT: Head: NCAT.  Right Ear: External ear normal.  Left Ear: External ear normal.  Eyes: . Pupils are equal, round, and reactive to light. Conjunctivae and EOM are normal Nose: without d/c or deformity Neck: Neck supple. Gross normal ROM Cardiovascular: Normal rate and regular rhythm.   Pulmonary/Chest: Effort normal and breath sounds without rales or wheezing.  Abd:  Soft, NT, ND, + BS, no organomegaly Neurological: Pt is alert. At baseline orientation, motor grossly intact Skin: Skin is warm. No rashes, other new lesions, no LE edema Psychiatric: Pt behavior is normal without agitation  All otherwise neg per pt Lab Results  Component Value Date   WBC 6.3 07/25/2020   HGB 15.0 07/25/2020   HCT 42.7 07/25/2020   PLT 264.0 07/25/2020   GLUCOSE 90 07/25/2020   CHOL 149 07/25/2020   TRIG 84.0 07/25/2020   HDL 46.90 07/25/2020   LDLCALC 85 07/25/2020   ALT 23 07/25/2020   AST 19 07/25/2020   NA 140 07/25/2020   K 4.5 07/25/2020   CL 105 07/25/2020   CREATININE 1.03 07/25/2020   BUN 20 07/25/2020   CO2 27 07/25/2020   TSH 1.08 07/25/2020   PSA 1.21 07/25/2020   HGBA1C 5.7 07/25/2020  Assessment & Plan:

## 2020-07-25 NOTE — Patient Instructions (Addendum)
You had the flu shot today  Please consider the new Shingles vaccincation at your convenience   Please continue all other medications as before, and refills have been done if requested.  Please have the pharmacy call with any other refills you may need.  Please continue your efforts at being more active, low cholesterol diet, and weight control.  You are otherwise up to date with prevention measures today.  Please keep your appointments with your specialists as you may have planned  Please go to the LAB at the blood drawing area for the tests to be done  You will be contacted by phone if any changes need to be made immediately.  Otherwise, you will receive a letter about your results with an explanation, but please check with MyChart first.  Please remember to sign up for MyChart if you have not done so, as this will be important to you in the future with finding out test results, communicating by private email, and scheduling acute appointments online when needed.  Please make an Appointment to return for your 1 year visit, or sooner if needed

## 2020-07-26 ENCOUNTER — Encounter: Payer: Self-pay | Admitting: Internal Medicine

## 2020-07-26 LAB — URINALYSIS, ROUTINE W REFLEX MICROSCOPIC
Bilirubin Urine: NEGATIVE
Hgb urine dipstick: NEGATIVE
Leukocytes,Ua: NEGATIVE
Nitrite: NEGATIVE
RBC / HPF: NONE SEEN
Specific Gravity, Urine: >=1.03 — AB (ref 1.000–1.030)
Total Protein, Urine: NEGATIVE
Urine Glucose: NEGATIVE
Urobilinogen, UA: 0.2 (ref 0.0–1.0)
WBC, UA: NONE SEEN
pH: 5.5 (ref 5.0–8.0)

## 2020-07-31 ENCOUNTER — Encounter: Payer: Self-pay | Admitting: Internal Medicine

## 2020-07-31 NOTE — Assessment & Plan Note (Signed)
stable overall by history and exam, recent data reviewed with pt, and pt to continue medical treatment as before,  to f/u any worsening symptoms or concerns  

## 2020-07-31 NOTE — Assessment & Plan Note (Signed)

## 2020-12-06 ENCOUNTER — Encounter: Payer: Self-pay | Admitting: Podiatry

## 2020-12-06 ENCOUNTER — Other Ambulatory Visit: Payer: Self-pay

## 2020-12-06 ENCOUNTER — Ambulatory Visit (INDEPENDENT_AMBULATORY_CARE_PROVIDER_SITE_OTHER): Payer: Medicare Other

## 2020-12-06 ENCOUNTER — Ambulatory Visit: Payer: Medicare Other | Admitting: Podiatry

## 2020-12-06 DIAGNOSIS — Q828 Other specified congenital malformations of skin: Secondary | ICD-10-CM

## 2020-12-06 DIAGNOSIS — M778 Other enthesopathies, not elsewhere classified: Secondary | ICD-10-CM

## 2020-12-06 NOTE — Progress Notes (Signed)
  Subjective:  Patient ID: Maxwell Hinton, male    DOB: 1946-02-12,  MRN: [id number] HPI Chief Complaint  Patient presents with  . Foot Pain    Dorsal and plantar forefoot left - aching x 15 years intermittent, no treatment, does have abrasions laterally from his ski boots, also tiny, callused areas sub 5th met area left, sometimes radiates up shin  . New Patient (Initial Visit)    75 y.o. male presents with the above complaint.   ROS: Denies fever chills nausea vomiting muscle aches pains calf pain back pain chest pain shortness of breath.  Past Medical History:  Diagnosis Date  . GERD (gastroesophageal reflux disease)   . Gilbert's syndrome 2010  . Other abnormal glucose 2010  . Skin cancer    Past Surgical History:  Procedure Laterality Date  . colonoscopy with polypectomy     X2 ; hyperplastic  . TONSILLECTOMY AND ADENOIDECTOMY    . TOTAL HIP ARTHROPLASTY      Current Outpatient Medications:  .  Chlorpheniramine Maleate (ALLERGY PO), Take by mouth., Disp: , Rfl:  .  Cyanocobalamin (B-12 PO), Take by mouth., Disp: , Rfl:  .  VITAMIN D PO, Take by mouth., Disp: , Rfl:  .  aspirin EC 81 MG tablet, Take 2 tablets (162 mg total) by mouth daily., Disp: 90 tablet, Rfl: 11 .  losartan (COZAAR) 50 MG tablet, Take 1 tablet (50 mg total) by mouth daily., Disp: 90 tablet, Rfl: 3  No Known Allergies Review of Systems Objective:  There were no vitals filed for this visit.  General: Well developed, nourished, in no acute distress, alert and oriented x3   Dermatological: Skin is warm, dry and supple bilateral. Nails x 10 are well maintained; remaining integument appears unremarkable at this time. There are no open sores, no preulcerative lesions, no rash or signs of infection present.  Vascular: Dorsalis Pedis artery and Posterior Tibial artery pedal pulses are 2/4 bilateral with immedate capillary fill time. Pedal hair growth present. No varicosities and no lower extremity edema  present bilateral.   Neruologic: Grossly intact via light touch bilateral. Vibratory intact via tuning fork bilateral. Protective threshold with Semmes Wienstein monofilament intact to all pedal sites bilateral. Patellar and Achilles deep tendon reflexes 2+ bilateral. No Babinski or clonus noted bilateral.   Musculoskeletal: No gross boney pedal deformities bilateral. No pain, crepitus, or limitation noted with foot and ankle range of motion bilateral. Muscular strength 5/5 in all groups tested bilateral.  Pain on palpation tarsometatarsal joints of the left foot.  Gait: Unassisted, Nonantalgic.    Radiographs:  Radiographs taken today demonstrate early osteoarthritic changes at the fourth fifth tarsometatarsal joints of the left foot.  Assessment & Plan:   Assessment: Capsulitis osteoarthritis fourth fifth tarsometatarsal left foot.  Plan: At this point he declined any injections or any oral treatments at this point he would like to consider Voltaren gel topical therapy.  We will follow-up with him as needed.     Detravion Tester T. [place], [place]

## 2020-12-27 ENCOUNTER — Ambulatory Visit (INDEPENDENT_AMBULATORY_CARE_PROVIDER_SITE_OTHER): Payer: Medicare Other | Admitting: Internal Medicine

## 2020-12-27 ENCOUNTER — Other Ambulatory Visit: Payer: Self-pay

## 2020-12-27 ENCOUNTER — Encounter: Payer: Self-pay | Admitting: Internal Medicine

## 2020-12-27 VITALS — BP 128/74 | HR 75 | Temp 98.3°F | Ht 79.0 in | Wt 271.0 lb

## 2020-12-27 DIAGNOSIS — T23221A Burn of second degree of single right finger (nail) except thumb, initial encounter: Secondary | ICD-10-CM | POA: Diagnosis not present

## 2020-12-27 DIAGNOSIS — R42 Dizziness and giddiness: Secondary | ICD-10-CM | POA: Diagnosis not present

## 2020-12-27 DIAGNOSIS — R2231 Localized swelling, mass and lump, right upper limb: Secondary | ICD-10-CM | POA: Diagnosis not present

## 2020-12-27 MED ORDER — MECLIZINE HCL 12.5 MG PO TABS: 12.5000 mg | ORAL_TABLET | Freq: Three times a day (TID) | ORAL | 1 refills | Status: AC | PRN

## 2020-12-27 MED ORDER — SILVER SULFADIAZINE 1 % EX CREA: 1.0000 "application " | TOPICAL_CREAM | Freq: Every day | CUTANEOUS | 1 refills | Status: DC

## 2020-12-27 NOTE — Progress Notes (Signed)
Patient ID: Maxwell Hinton, male   DOB: 1945-12-02, 75 y.o.   MRN: [id number]        Chief Complaint: dizziness, hand burn, and skin nodule       HPI:  Maxwell Hinton is a 75 y.o. male here with c/o an episode of toppling over uncontrollably with sudden room spinning after just walking up to the top of stairs and bending forward to tie a shoe that had become untied; pt suffered no injury, and not recurred, but this had never happened before or since.  Pt denies chest pain, increased sob or doe, wheezing, orthopnea, PND, increased LE swelling, palpitations, dizziness or syncope.   Pt denies polydipsia, polyuria, Denies other new neuro focal s/s   Pt denies fever, wt loss, night sweats, loss of appetite, or other constitutional symptoms.  Also 5 days ago had an episode of grasping a hot pan handle by accident and suffering immediate burn to the palm and proxinmal index finger at the DIP.  Also, mentions a subq firm nodule new just under the skin at the lateral aspect of the 5th mcp right hand that had not been there previously - is sort of firm and mobile, just hoping it is not a serious problem. No other knots new or other noted        Wt Readings from Last 3 Encounters:  12/27/20 271 lb (122.9 kg)  07/25/20 258 lb (117 kg)  09/06/19 276 lb (125.2 kg)   BP Readings from Last 3 Encounters:  12/27/20 128/74  07/25/20 110/68  09/06/19 126/82         Past Medical History:  Diagnosis Date  . GERD (gastroesophageal reflux disease)   . Gilbert's syndrome 2010  . Other abnormal glucose 2010  . Skin cancer    Past Surgical History:  Procedure Laterality Date  . colonoscopy with polypectomy     X2 ; hyperplastic  . TONSILLECTOMY AND ADENOIDECTOMY    . TOTAL HIP ARTHROPLASTY      reports that he has been smoking cigars. He has never used smokeless tobacco. He reports current alcohol use of about 2.0 standard drinks of alcohol per week. He reports that he does not use drugs. family  history includes Diabetes in his mother; Lung cancer in his brother. No Known Allergies Current Outpatient Medications on File Prior to Visit  Medication Sig Dispense Refill  . aspirin EC 81 MG tablet Take 2 tablets (162 mg total) by mouth daily. 90 tablet 11  . Chlorpheniramine Maleate (ALLERGY PO) Take by mouth.    . Cyanocobalamin (B-12 PO) Take by mouth.    . losartan (COZAAR) 50 MG tablet Take 1 tablet (50 mg total) by mouth daily. 90 tablet 3  . VITAMIN D PO Take by mouth.     No current facility-administered medications on file prior to visit.        ROS:  All others reviewed and negative.  Objective        PE:  BP 128/74   Pulse 75   Temp 98.3 F (36.8 C) (Oral)   Ht 6\' 7"  (2.007 m)   Wt 271 lb (122.9 kg)   SpO2 95%   BMI 30.53 kg/m                 Constitutional: Pt appears in NAD               HENT: Head: NCAT.  Right Ear: External ear normal.                 Left Ear: External ear normal.                Eyes: . Pupils are equal, round, and reactive to light. Conjunctivae and EOM are normal               Nose: without d/c or deformity               Neck: Neck supple. Gross normal ROM               Cardiovascular: Normal rate and regular rhythm.                 Pulmonary/Chest: Effort normal and breath sounds without rales or wheezing.                Abd:  Soft, NT, ND, + BS, no organomegaly               Neurological: Pt is alert. At baseline orientation, motor grossly intact, cn 2-12 intact               Skin: LE edema - none, right palm with 2 > dime sized healing scabbed superficial burn without erythema swelling or drainage; right first finger DIP anteriorly with very superficial similar wound that is open at the joint but still not red, tender, or drainage, only non discrete swelling noted and hand/finger o/w neurovasc intact; also has a firm but nontender mobile smooth nodule just subq at the most medial aspect of the right hand 5th mcp without  overlying skin change                       Psychiatric: Pt behavior is normal without agitation   Micro: none  Cardiac tracings I have personally interpreted today:  NSR 79  Pertinent Radiological findings (summarize): none   Lab Results  Component Value Date   WBC 6.3 07/25/2020   HGB 15.0 07/25/2020   HCT 42.7 07/25/2020   PLT 264.0 07/25/2020   GLUCOSE 90 07/25/2020   CHOL 149 07/25/2020   TRIG 84.0 07/25/2020   HDL 46.90 07/25/2020   LDLCALC 85 07/25/2020   ALT 23 07/25/2020   AST 19 07/25/2020   NA 140 07/25/2020   K 4.5 07/25/2020   CL 105 07/25/2020   CREATININE 1.03 07/25/2020   BUN 20 07/25/2020   CO2 27 07/25/2020   TSH 1.08 07/25/2020   PSA 1.21 07/25/2020   HGBA1C 5.7 07/25/2020   Assessment/Plan:  Maxwell Hinton is a 75 y.o. White or Caucasian [1] male with  has a past medical history of GERD (gastroesophageal reflux disease), Gilbert's syndrome (2010), Other abnormal glucose (2010), and Skin cancer.  Second degree burn of finger, right, initial encounter Without s/s infection, but for silvadene cream,  to f/u any worsening symptoms or concerns  Vertigo Rather dramatic occurrence to him but no injury, no recurrence and exam benign, ok to follow, gave meclizine prn any recurrence.  Nodule of skin of right hand D/w pt - new onset but benign appearance, and ok to follow, to consider hand surgury for any further changes  Followup: Return if symptoms worsen or fail to improve.  Cathlean Cower, MD 12/30/2020 4:44 AM [facility]

## 2020-12-27 NOTE — Patient Instructions (Signed)
Please take all new medication as prescribed - for vertigo, and the hand burn  Please continue all other medications as before, and refills have been done if requested.  Please have the pharmacy call with any other refills you may need.  Please continue your efforts at being more active, low cholesterol diet, and weight control.  Please keep your appointments with your specialists as you may have planned

## 2020-12-30 ENCOUNTER — Encounter: Payer: Self-pay | Admitting: Internal Medicine

## 2020-12-30 DIAGNOSIS — R2231 Localized swelling, mass and lump, right upper limb: Secondary | ICD-10-CM | POA: Insufficient documentation

## 2020-12-30 DIAGNOSIS — T23221A Burn of second degree of single right finger (nail) except thumb, initial encounter: Secondary | ICD-10-CM | POA: Insufficient documentation

## 2020-12-30 DIAGNOSIS — R42 Dizziness and giddiness: Secondary | ICD-10-CM | POA: Insufficient documentation

## 2020-12-30 NOTE — Assessment & Plan Note (Signed)
D/w pt - new onset but benign appearance, and ok to follow, to consider hand surgury for any further changes

## 2020-12-30 NOTE — Assessment & Plan Note (Signed)
Rather dramatic occurrence to him but no injury, no recurrence and exam benign, ok to follow, gave meclizine prn any recurrence.

## 2020-12-30 NOTE — Assessment & Plan Note (Signed)
Without s/s infection, but for silvadene cream,  to f/u any worsening symptoms or concerns

## 2021-03-27 ENCOUNTER — Encounter: Payer: Self-pay | Admitting: Internal Medicine

## 2021-03-29 ENCOUNTER — Other Ambulatory Visit (HOSPITAL_BASED_OUTPATIENT_CLINIC_OR_DEPARTMENT_OTHER): Payer: Self-pay

## 2021-03-29 ENCOUNTER — Ambulatory Visit (INDEPENDENT_AMBULATORY_CARE_PROVIDER_SITE_OTHER): Payer: Medicare Other | Admitting: Internal Medicine

## 2021-03-29 ENCOUNTER — Encounter: Payer: Self-pay | Admitting: Internal Medicine

## 2021-03-29 ENCOUNTER — Ambulatory Visit: Payer: Medicare Other | Attending: Internal Medicine

## 2021-03-29 ENCOUNTER — Other Ambulatory Visit: Payer: Self-pay

## 2021-03-29 VITALS — BP 142/76 | HR 92 | Temp 98.2°F | Ht 79.0 in | Wt 270.0 lb

## 2021-03-29 DIAGNOSIS — Z23 Encounter for immunization: Secondary | ICD-10-CM

## 2021-03-29 DIAGNOSIS — L989 Disorder of the skin and subcutaneous tissue, unspecified: Secondary | ICD-10-CM | POA: Insufficient documentation

## 2021-03-29 DIAGNOSIS — R739 Hyperglycemia, unspecified: Secondary | ICD-10-CM

## 2021-03-29 DIAGNOSIS — I10 Essential (primary) hypertension: Secondary | ICD-10-CM

## 2021-03-29 MED ORDER — PFIZER-BIONT COVID-19 VAC-TRIS 30 MCG/0.3ML IM SUSP
INTRAMUSCULAR | 0 refills | Status: DC
  Filled 2021-03-29: qty 0.3, 1d supply, fill #0

## 2021-03-29 NOTE — Progress Notes (Signed)
Patient ID: Maxwell Hinton, male   DOB: Jun 02, 1946, 75 y.o.   MRN: [id number]        Chief Complaint: follow up HTN, hyperglycemia, and skin lesion right lower leg        HPI:  Maxwell Hinton is a 75 y.o. male here with c/o right distal leg lesion for 1 month with mild discomfort and itchiness, but no hx of trauma, just seemed to come up, and he presumed it was some kind of insect related, but never saw an insect and no trauma associated.  No fever, chills, red streaks, swelling or drainage.  Pt denies chest pain, increased sob or doe, wheezing, orthopnea, PND, increased LE swelling, palpitations, dizziness or syncope.   Pt denies polydipsia, polyuria, or new focal neuro s/s.  No other new complaints.        Wt Readings from Last 3 Encounters:  03/29/21 270 lb (122.5 kg)  12/27/20 271 lb (122.9 kg)  07/25/20 258 lb (117 kg)   BP Readings from Last 3 Encounters:  03/29/21 (!) 142/76  12/27/20 128/74  07/25/20 110/68         Past Medical History:  Diagnosis Date   GERD (gastroesophageal reflux disease)    Gilbert's syndrome 2010   Other abnormal glucose 2010   Skin cancer    Past Surgical History:  Procedure Laterality Date   colonoscopy with polypectomy     X2 ; hyperplastic   TONSILLECTOMY AND ADENOIDECTOMY     TOTAL HIP ARTHROPLASTY      reports that he has been smoking cigars. He has never used smokeless tobacco. He reports current alcohol use of about 2.0 standard drinks of alcohol per week. He reports that he does not use drugs. family history includes Diabetes in his mother; Lung cancer in his brother. No Known Allergies Current Outpatient Medications on File Prior to Visit  Medication Sig Dispense Refill   aspirin EC 81 MG tablet Take 2 tablets (162 mg total) by mouth daily. 90 tablet 11   Chlorpheniramine Maleate (ALLERGY PO) Take by mouth.     Cyanocobalamin (B-12 PO) Take by mouth.     losartan (COZAAR) 50 MG tablet Take 1 tablet (50 mg total) by mouth daily.  90 tablet 3   meclizine (ANTIVERT) 12.5 MG tablet Take 1 tablet (12.5 mg total) by mouth 3 (three) times daily as needed for dizziness. 30 tablet 1   silver sulfADIAZINE (SILVADENE) 1 % cream Apply 1 application topically daily. 50 g 1   VITAMIN D PO Take by mouth.     No current facility-administered medications on file prior to visit.        ROS:  All others reviewed and negative.  Objective        PE:  BP (!) 142/76 (BP Location: Left Arm, Patient Position: Sitting, Cuff Size: Large)   Pulse 92   Temp 98.2 F (36.8 C) (Oral)   Ht 6\' 7"  (2.007 m)   Wt 270 lb (122.5 kg)   SpO2 93%   BMI 30.42 kg/m                 Constitutional: Pt appears in NAD               HENT: Head: NCAT.                Right Ear: External ear normal.                 Left Ear: External  ear normal.                Eyes: . Pupils are equal, round, and reactive to light. Conjunctivae and EOM are normal               Nose: without d/c or deformity               Neck: Neck supple. Gross normal ROM               Cardiovascular: Normal rate and regular rhythm.                 Pulmonary/Chest: Effort normal and breath sounds without rales or wheezing.                Abd:  Soft, NT, ND, + BS, no organomegaly               Neurological: Pt is alert. At baseline orientation, motor grossly intact               Skin: Skin is warm. No rashes, no other new lesions, LE edema - right lower leg distal anterior slightly raised lesion with central necrotic area nontender without associated s/s cellulitis               Psychiatric: Pt behavior is normal without agitation   Micro: none  Cardiac tracings I have personally interpreted today:  none  Pertinent Radiological findings (summarize): none   Lab Results  Component Value Date   WBC 6.3 07/25/2020   HGB 15.0 07/25/2020   HCT 42.7 07/25/2020   PLT 264.0 07/25/2020   GLUCOSE 90 07/25/2020   CHOL 149 07/25/2020   TRIG 84.0 07/25/2020   HDL 46.90 07/25/2020    LDLCALC 85 07/25/2020   ALT 23 07/25/2020   AST 19 07/25/2020   NA 140 07/25/2020   K 4.5 07/25/2020   CL 105 07/25/2020   CREATININE 1.03 07/25/2020   BUN 20 07/25/2020   CO2 27 07/25/2020   TSH 1.08 07/25/2020   PSA 1.21 07/25/2020   HGBA1C 5.7 07/25/2020   Assessment/Plan:  Maxwell Hinton is a 75 y.o. White or Caucasian [1] male with  has a past medical history of GERD (gastroesophageal reflux disease), Gilbert's syndrome (2010), Other abnormal glucose (2010), and Skin cancer.  Skin lesion of right leg Lesion most c/w possible basal cell ca - for derm referral,  to f/u any worsening symptoms or concerns  Hyperglycemia Lab Results  Component Value Date   HGBA1C 5.7 07/25/2020   Stable, pt to continue current medical treatment - diet   HTN (hypertension) BP Readings from Last 3 Encounters:  03/29/21 (!) 142/76  12/27/20 128/74  07/25/20 110/68   Stable, pt to continue medical treatment losartan  Followup: Return if symptoms worsen or fail to improve.  Cathlean Cower, MD 04/01/2021 7:19 PM [facility]

## 2021-03-29 NOTE — Progress Notes (Signed)
   Covid-19 Vaccination Clinic  Name:  Maxwell Hinton    MRN: [id number] DOB: [birth year]  03/29/2021  Mr. Maxwell Hinton was observed post Covid-19 immunization for 15 minutes without incident. He was provided with Vaccine Information Sheet and instruction to access the V-Safe system.   Mr. Maxwell Hinton was instructed to call 911 with any severe reactions post vaccine: Difficulty breathing  Swelling of face and throat  A fast heartbeat  A bad rash all over body  Dizziness and weakness   Immunizations Administered     Name Date Dose VIS Date Route   PFIZER Comrnaty(Gray TOP) Covid-19 Vaccine 03/29/2021  9:58 AM 0.3 mL 09/20/2020 Intramuscular   Manufacturer: [place]   Lot: [id number]   [place]: [phone]

## 2021-04-01 ENCOUNTER — Encounter: Payer: Self-pay | Admitting: Internal Medicine

## 2021-04-01 NOTE — Assessment & Plan Note (Signed)
BP Readings from Last 3 Encounters:  03/29/21 (!) 142/76  12/27/20 128/74  07/25/20 110/68   Stable, pt to continue medical treatment losartan

## 2021-04-01 NOTE — Patient Instructions (Signed)
You will be contacted regarding the referral for: dermatology

## 2021-04-01 NOTE — Assessment & Plan Note (Signed)
Lab Results  Component Value Date   HGBA1C 5.7 07/25/2020   Stable, pt to continue current medical treatment - diet

## 2021-04-01 NOTE — Assessment & Plan Note (Signed)
Lesion most c/w possible basal cell ca - for derm referral,  to f/u any worsening symptoms or concerns

## 2021-04-15 DIAGNOSIS — Z20822 Contact with and (suspected) exposure to covid-19: Secondary | ICD-10-CM | POA: Diagnosis not present

## 2021-05-31 ENCOUNTER — Telehealth: Payer: Self-pay | Admitting: Internal Medicine

## 2021-05-31 NOTE — Telephone Encounter (Signed)
Left message for patient to call me back at [phone] to schedule Medicare Annual Wellness Visit   No hx of AWV eligible as of 04/12/10  Please schedule at anytime with [facility] Advisor if patient calls the office back.    59 Minutes appointment   Any questions, please call me at [phone]

## 2021-07-24 DIAGNOSIS — L570 Actinic keratosis: Secondary | ICD-10-CM | POA: Diagnosis not present

## 2021-07-24 DIAGNOSIS — L820 Inflamed seborrheic keratosis: Secondary | ICD-10-CM | POA: Diagnosis not present

## 2021-07-24 DIAGNOSIS — Z23 Encounter for immunization: Secondary | ICD-10-CM | POA: Diagnosis not present

## 2021-07-26 ENCOUNTER — Encounter: Payer: Medicare Other | Admitting: Internal Medicine

## 2021-08-20 ENCOUNTER — Other Ambulatory Visit: Payer: Self-pay | Admitting: Internal Medicine

## 2021-08-20 NOTE — Telephone Encounter (Signed)
Please refill as per office routine med refill policy (all routine meds to be refilled for 3 mo or monthly (per pt preference) up to one year from last visit, then month to month grace period for 3 mo, then further med refills will have to be denied) ? ?

## 2021-08-20 NOTE — Telephone Encounter (Signed)
See below

## 2021-10-16 DIAGNOSIS — H5203 Hypermetropia, bilateral: Secondary | ICD-10-CM | POA: Diagnosis not present

## 2021-11-19 DIAGNOSIS — M25561 Pain in right knee: Secondary | ICD-10-CM | POA: Diagnosis not present

## 2021-11-19 DIAGNOSIS — M25562 Pain in left knee: Secondary | ICD-10-CM | POA: Diagnosis not present

## 2021-11-20 ENCOUNTER — Encounter: Payer: Self-pay | Admitting: Internal Medicine

## 2022-01-27 DIAGNOSIS — M25562 Pain in left knee: Secondary | ICD-10-CM | POA: Diagnosis not present

## 2022-01-27 DIAGNOSIS — M25561 Pain in right knee: Secondary | ICD-10-CM | POA: Diagnosis not present

## 2022-02-13 ENCOUNTER — Other Ambulatory Visit: Payer: Self-pay | Admitting: Internal Medicine

## 2022-02-13 NOTE — Telephone Encounter (Signed)
Please refill as per office routine med refill policy (all routine meds to be refilled for 3 mo or monthly (per pt preference) up to one year from last visit, then month to month grace period for 3 mo, then further med refills will have to be denied) ? ?

## 2022-03-13 ENCOUNTER — Ambulatory Visit: Payer: Medicare Other | Admitting: Internal Medicine

## 2022-03-13 ENCOUNTER — Encounter: Payer: Self-pay | Admitting: Internal Medicine

## 2022-03-13 VITALS — BP 132/66 | HR 80 | Temp 97.7°F | Ht 79.0 in | Wt 280.0 lb

## 2022-03-13 DIAGNOSIS — Z125 Encounter for screening for malignant neoplasm of prostate: Secondary | ICD-10-CM | POA: Diagnosis not present

## 2022-03-13 DIAGNOSIS — Z01818 Encounter for other preprocedural examination: Secondary | ICD-10-CM | POA: Diagnosis not present

## 2022-03-13 DIAGNOSIS — R739 Hyperglycemia, unspecified: Secondary | ICD-10-CM | POA: Diagnosis not present

## 2022-03-13 DIAGNOSIS — E538 Deficiency of other specified B group vitamins: Secondary | ICD-10-CM | POA: Diagnosis not present

## 2022-03-13 DIAGNOSIS — I10 Essential (primary) hypertension: Secondary | ICD-10-CM

## 2022-03-13 DIAGNOSIS — E559 Vitamin D deficiency, unspecified: Secondary | ICD-10-CM | POA: Diagnosis not present

## 2022-03-13 DIAGNOSIS — Z0001 Encounter for general adult medical examination with abnormal findings: Secondary | ICD-10-CM | POA: Diagnosis not present

## 2022-03-13 DIAGNOSIS — E7800 Pure hypercholesterolemia, unspecified: Secondary | ICD-10-CM | POA: Diagnosis not present

## 2022-03-13 DIAGNOSIS — F17200 Nicotine dependence, unspecified, uncomplicated: Secondary | ICD-10-CM

## 2022-03-13 MED ORDER — TRAMADOL HCL 50 MG PO TABS: 50.0000 mg | ORAL_TABLET | Freq: Four times a day (QID) | ORAL | 0 refills | Status: DC | PRN

## 2022-03-13 MED ORDER — LOSARTAN POTASSIUM 50 MG PO TABS: 50.0000 mg | ORAL_TABLET | Freq: Every day | ORAL | 3 refills | Status: DC

## 2022-03-13 NOTE — Patient Instructions (Addendum)
Please check with your pharmacy about needing the second Shingrix shot  Your EKG was done today  Your form will be sent to [facility] asap  Please continue all other medications as before, and refills have been done if requested.  Please have the pharmacy call with any other refills you may need.  Please continue your efforts at being more active, low cholesterol diet, and weight control.  You are otherwise up to date with prevention measures today.  Please keep your appointments with your specialists as you may have planned  Please go to the LAB at the blood drawing area for the tests to be done  You will be contacted by phone if any changes need to be made immediately.  Otherwise, you will receive a letter about your results with an explanation, but please check with MyChart first.  Please remember to sign up for MyChart if you have not done so, as this will be important to you in the future with finding out test results, communicating by private email, and scheduling acute appointments online when needed.  Please make an Appointment to return for your 1 year visit, or sooner if needed

## 2022-03-13 NOTE — Progress Notes (Signed)
Patient ID: Maxwell Hinton, male   DOB: March 31, 1946, 76 y.o.   MRN: [id number]

## 2022-03-13 NOTE — Progress Notes (Signed)
Patient ID: Maxwell Hinton, male   DOB: [birth year], 76 y.o.   MRN: [id number]         Chief Complaint:: wellness exam and preop exam, htn, hyperglycmemia, hld       HPI:  Maxwell Hinton is a 76 y.o. male here for wellness exam; has had first shingles shot but cant recall when 2nd is due - plans to f/u with his pharmacy about this; declines covid booster, shingrix, tdap o/w up to date                        Also needs form sign and returned to [facility] regarding preop clearance for left TKR soon.  Pt denies chest pain, increased sob or doe, wheezing, orthopnea, PND, increased LE swelling, palpitations, dizziness or syncope.   Pt denies polydipsia, polyuria, or new focal neuro s/s.    Pt denies fever, wt loss, night sweats, loss of appetite, or other constitutional symptoms    Has been less active altely due to need both knees TKR - scheduled for the left TKR July 28.  Wt up 10 lbs which doesn't help the knee pain  Still smoking cigars, not ready to quit.  Wt Readings from Last 3 Encounters:  03/13/22 280 lb (127 kg)  03/29/21 270 lb (122.5 kg)  12/27/20 271 lb (122.9 kg)   BP Readings from Last 3 Encounters:  03/13/22 132/66  03/29/21 (!) 142/76  12/27/20 128/74   Immunization History  Administered Date(s) Administered   Fluad Quad(high Dose 65+) 07/18/2019, 07/25/2020   H1N1 11/02/2008   Influenza Split 09/30/2011, 10/21/2012   Influenza, High Dose Seasonal PF 08/27/2017, 08/27/2018   Influenza,inj,Quad PF,6+ Mos 11/07/2014, 08/22/2015   PFIZER Comirnaty(Gray Top)Covid-19 Tri-Sucrose Vaccine 03/29/2021   PFIZER(Purple Top)SARS-COV-2 Vaccination 11/19/2019, 12/14/2019, 07/25/2020   Pneumococcal Conjugate-13 10/02/2015   Pneumococcal Polysaccharide-23 05/19/2018   Td 08/23/2010   Zoster, Live 03/01/2013   There are no preventive care reminders to display for this patient.     Past Medical History:  Diagnosis Date   GERD (gastroesophageal reflux disease)     Gilbert's syndrome 2010   Other abnormal glucose 2010   Skin cancer    Past Surgical History:  Procedure Laterality Date   colonoscopy with polypectomy     X2 ; hyperplastic   TONSILLECTOMY AND ADENOIDECTOMY     TOTAL HIP ARTHROPLASTY      reports that he has been smoking cigars. He has never used smokeless tobacco. He reports current alcohol use of about 2.0 standard drinks per week. He reports that he does not use drugs. family history includes Diabetes in his mother; Lung cancer in his brother. No Known Allergies Current Outpatient Medications on File Prior to Visit  Medication Sig Dispense Refill   aspirin EC 81 MG tablet Take 2 tablets (162 mg total) by mouth daily. 90 tablet 11   Chlorpheniramine Maleate (ALLERGY PO) Take by mouth.     Cyanocobalamin (B-12 PO) Take by mouth.     silver sulfADIAZINE (SILVADENE) 1 % cream Apply 1 application topically daily. 50 g 1   VITAMIN D PO Take by mouth.     No current facility-administered medications on file prior to visit.        ROS:  All others reviewed and negative.  Objective        PE:  BP 132/66 (BP Location: Right Arm, Patient Position: Sitting, Cuff Size: Large)   Pulse 80   Temp 97.7 F (  36.5 C) (Oral)   Ht '6\' 7"'$  (2.007 m)   Wt 280 lb (127 kg)   SpO2 98%   BMI 31.54 kg/m                 Constitutional: Pt appears in NAD               HENT: Head: NCAT.                Right Ear: External ear normal.                 Left Ear: External ear normal.                Eyes: . Pupils are equal, round, and reactive to light. Conjunctivae and EOM are normal               Nose: without d/c or deformity               Neck: Neck supple. Gross normal ROM               Cardiovascular: Normal rate and regular rhythm.                 Pulmonary/Chest: Effort normal and breath sounds without rales or wheezing.                Abd:  Soft, NT, ND, + BS, no organomegaly               Neurological: Pt is alert. At baseline orientation,  motor grossly intact               Skin: Skin is warm. No rashes, no other new lesions, LE edema - none               Bilateral knees with degenerative changes, left > right               Psychiatric: Pt behavior is normal without agitation   Micro: none  Cardiac tracings I have personally interpreted today:  NSR 77  Pertinent Radiological findings (summarize): none   Lab Results  Component Value Date   WBC 5.1 03/14/2022   HGB 15.3 03/14/2022   HCT 44.7 03/14/2022   PLT 257.0 03/14/2022   GLUCOSE 110 (H) 03/14/2022   CHOL 174 03/14/2022   TRIG 121.0 03/14/2022   HDL 48.80 03/14/2022   LDLCALC 101 (H) 03/14/2022   ALT 36 03/14/2022   AST 23 03/14/2022   NA 137 03/14/2022   K 4.4 03/14/2022   CL 104 03/14/2022   CREATININE 0.99 03/14/2022   BUN 17 03/14/2022   CO2 26 03/14/2022   TSH 1.33 03/14/2022   PSA 1.39 03/14/2022   HGBA1C 6.0 03/14/2022   Assessment/Plan:  Maxwell Hinton is a 76 y.o. White or Caucasian [1] male with  has a past medical history of GERD (gastroesophageal reflux disease), Gilbert's syndrome (2010), Other abnormal glucose (2010), and Skin cancer.  Smoker Pt counsled to quit, pt not ready  Preop exam for internal medicine ECG reviewed, exam o/w benign, pt cleared for surgury as planned, form to be signed and returned  Encounter for well adult exam with abnormal findings Age and sex appropriate education and counseling updated with regular exercise and diet Referrals for preventative services - none needed Immunizations addressed - declines covid booster, shingrix, tdap for now Smoking counseling  - counsled to quit , pt not reaady Evidence for depression or other mood disorder - none  significant Most recent labs reviewed. I have personally reviewed and have noted: 1) the patient's medical and social history 2) The patient's current medications and supplements 3) The patient's height, weight, and BMI have been recorded in the  chart   Hyperglycemia Lab Results  Component Value Date   HGBA1C 6.0 03/14/2022   Stable, pt to continue current medical treatment diet, wt control, excericse as able  HTN (hypertension) BP Readings from Last 3 Encounters:  03/13/22 132/66  03/29/21 (!) 142/76  12/27/20 128/74   Stable, pt to continue medical treatment losartan 50 mg qd   HLD (hyperlipidemia) Lab Results  Component Value Date   LDLCALC 101 (H) 03/14/2022   Uncontrolled, goal ldl < 100, pt to continue current low chol diet, declines statin  Followup: Return in about 1 year (around 03/14/2023).  Cathlean Cower, MD 03/16/2022 9:29 AM [facility]

## 2022-03-14 ENCOUNTER — Other Ambulatory Visit (INDEPENDENT_AMBULATORY_CARE_PROVIDER_SITE_OTHER): Payer: Medicare Other

## 2022-03-14 DIAGNOSIS — R739 Hyperglycemia, unspecified: Secondary | ICD-10-CM

## 2022-03-14 DIAGNOSIS — E538 Deficiency of other specified B group vitamins: Secondary | ICD-10-CM

## 2022-03-14 DIAGNOSIS — Z125 Encounter for screening for malignant neoplasm of prostate: Secondary | ICD-10-CM | POA: Diagnosis not present

## 2022-03-14 DIAGNOSIS — E559 Vitamin D deficiency, unspecified: Secondary | ICD-10-CM

## 2022-03-14 DIAGNOSIS — I10 Essential (primary) hypertension: Secondary | ICD-10-CM

## 2022-03-14 LAB — HEPATIC FUNCTION PANEL
ALT: 36 U/L (ref 0–53)
AST: 23 U/L (ref 0–37)
Albumin: 4.6 g/dL (ref 3.5–5.2)
Alkaline Phosphatase: 42 U/L (ref 39–117)
Bilirubin, Direct: 0.2 mg/dL (ref 0.0–0.3)
Total Bilirubin: 1 mg/dL (ref 0.2–1.2)
Total Protein: 6.8 g/dL (ref 6.0–8.3)

## 2022-03-14 LAB — CBC WITH DIFFERENTIAL/PLATELET
Basophils Absolute: 0.1 10*3/uL (ref 0.0–0.1)
Basophils Relative: 1.3 % (ref 0.0–3.0)
Eosinophils Absolute: 0.1 10*3/uL (ref 0.0–0.7)
Eosinophils Relative: 1.6 % (ref 0.0–5.0)
HCT: 44.7 % (ref 39.0–52.0)
Hemoglobin: 15.3 g/dL (ref 13.0–17.0)
Lymphocytes Relative: 34.1 % (ref 12.0–46.0)
Lymphs Abs: 1.7 10*3/uL (ref 0.7–4.0)
MCHC: 34.1 g/dL (ref 30.0–36.0)
MCV: 88 fl (ref 78.0–100.0)
Monocytes Absolute: 0.4 10*3/uL (ref 0.1–1.0)
Monocytes Relative: 7.7 % (ref 3.0–12.0)
Neutro Abs: 2.8 10*3/uL (ref 1.4–7.7)
Neutrophils Relative %: 55.3 % (ref 43.0–77.0)
Platelets: 257 10*3/uL (ref 150.0–400.0)
RBC: 5.08 Mil/uL (ref 4.22–5.81)
RDW: 13.4 % (ref 11.5–15.5)
WBC: 5.1 10*3/uL (ref 4.0–10.5)

## 2022-03-14 LAB — LIPID PANEL
Cholesterol: 174 mg/dL (ref 0–200)
HDL: 48.8 mg/dL (ref >39.00)
LDL Cholesterol: 101 mg/dL — ABNORMAL HIGH (ref 0–99)
NonHDL: 124.84
Total CHOL/HDL Ratio: 4
Triglycerides: 121 mg/dL (ref 0.0–149.0)
VLDL: 24.2 mg/dL (ref 0.0–40.0)

## 2022-03-14 LAB — URINALYSIS, ROUTINE W REFLEX MICROSCOPIC
Bilirubin Urine: NEGATIVE
Hgb urine dipstick: NEGATIVE
Ketones, ur: NEGATIVE
Leukocytes,Ua: NEGATIVE
Nitrite: NEGATIVE
RBC / HPF: NONE SEEN (ref 0–?)
Specific Gravity, Urine: 1.01 (ref 1.000–1.030)
Total Protein, Urine: NEGATIVE
Urine Glucose: NEGATIVE
Urobilinogen, UA: 0.2 (ref 0.0–1.0)
WBC, UA: NONE SEEN (ref 0–?)
pH: 6 (ref 5.0–8.0)

## 2022-03-14 LAB — HEMOGLOBIN A1C: Hgb A1c MFr Bld: 6 % (ref 4.6–6.5)

## 2022-03-14 LAB — PSA: PSA: 1.39 ng/mL (ref 0.10–4.00)

## 2022-03-14 LAB — BASIC METABOLIC PANEL
BUN: 17 mg/dL (ref 6–23)
CO2: 26 mEq/L (ref 19–32)
Calcium: 9.7 mg/dL (ref 8.4–10.5)
Chloride: 104 mEq/L (ref 96–112)
Creatinine, Ser: 0.99 mg/dL (ref 0.40–1.50)
GFR: 75.36 mL/min (ref >60.00)
Glucose, Bld: 110 mg/dL — ABNORMAL HIGH (ref 70–99)
Potassium: 4.4 mEq/L (ref 3.5–5.1)
Sodium: 137 mEq/L (ref 135–145)

## 2022-03-14 LAB — TSH: TSH: 1.33 u[IU]/mL (ref 0.35–5.50)

## 2022-03-14 LAB — VITAMIN B12: Vitamin B-12: >1504 pg/mL — ABNORMAL HIGH (ref 211–911)

## 2022-03-14 LAB — VITAMIN D 25 HYDROXY (VIT D DEFICIENCY, FRACTURES): VITD: 46.51 ng/mL (ref 30.00–100.00)

## 2022-03-16 ENCOUNTER — Encounter: Payer: Self-pay | Admitting: Internal Medicine

## 2022-03-16 DIAGNOSIS — F17200 Nicotine dependence, unspecified, uncomplicated: Secondary | ICD-10-CM | POA: Insufficient documentation

## 2022-03-16 DIAGNOSIS — E785 Hyperlipidemia, unspecified: Secondary | ICD-10-CM | POA: Insufficient documentation

## 2022-03-16 NOTE — Assessment & Plan Note (Signed)
Pt counsled to quit, pt not ready °

## 2022-03-16 NOTE — Assessment & Plan Note (Signed)
Lab Results  Component Value Date   HGBA1C 6.0 03/14/2022   Stable, pt to continue current medical treatment diet, wt control, excericse as able

## 2022-03-16 NOTE — Assessment & Plan Note (Signed)
ECG reviewed, exam o/w benign, pt cleared for surgury as planned, form to be signed and returned

## 2022-03-16 NOTE — Assessment & Plan Note (Signed)
Lab Results  Component Value Date   LDLCALC 101 (H) 03/14/2022   Uncontrolled, goal ldl < 100, pt to continue current low chol diet, declines statin

## 2022-03-16 NOTE — Assessment & Plan Note (Signed)
BP Readings from Last 3 Encounters:  03/13/22 132/66  03/29/21 (!) 142/76  12/27/20 128/74   Stable, pt to continue medical treatment losartan 50 mg qd

## 2022-03-16 NOTE — Assessment & Plan Note (Signed)
Age and sex appropriate education and counseling updated with regular exercise and diet Referrals for preventative services - none needed Immunizations addressed - declines covid booster, shingrix, tdap for now Smoking counseling  - counsled to quit , pt not reaady Evidence for depression or other mood disorder - none significant Most recent labs reviewed. I have personally reviewed and have noted: 1) the patient's medical and social history 2) The patient's current medications and supplements 3) The patient's height, weight, and BMI have been recorded in the chart

## 2022-04-17 ENCOUNTER — Ambulatory Visit: Payer: Self-pay | Admitting: Physician Assistant

## 2022-04-17 DIAGNOSIS — M25562 Pain in left knee: Secondary | ICD-10-CM | POA: Diagnosis not present

## 2022-04-17 DIAGNOSIS — G8929 Other chronic pain: Secondary | ICD-10-CM

## 2022-04-17 NOTE — H&P (Signed)
TOTAL KNEE ADMISSION H&P  Patient is being admitted for left total knee arthroplasty.  Subjective:  Chief Complaint:left knee pain.  HPI: Maxwell Hinton, 76 y.o. male, has a history of pain and functional disability in the left knee due to arthritis and has failed non-surgical conservative treatments for greater than 12 weeks to includeNSAID's and/or analgesics and activity modification.  Onset of symptoms was gradual, starting 6 years ago with gradually worsening course since that time. The patient noted no past surgery on the left knee(s).  Patient currently rates pain in the left knee(s) at 8 out of 10 with activity. Patient has night pain, worsening of pain with activity and weight bearing, pain that interferes with activities of daily living, pain with passive range of motion, crepitus, and joint swelling.  Patient has evidence of periarticular osteophytes and joint space narrowing by imaging studies. There is no active infection.  Patient Active Problem List   Diagnosis Date Noted   Smoker 03/16/2022   HLD (hyperlipidemia) 03/16/2022   Preop exam for internal medicine 03/13/2022   Skin lesion of right leg 03/29/2021   Second degree burn of finger, right, initial encounter 12/30/2020   Vertigo 12/30/2020   Nodule of skin of right hand 12/30/2020   Abscess of right axilla 09/06/2019   HTN (hypertension) 05/19/2018   Hyperglycemia 05/13/2017   Encounter for well adult exam with abnormal findings 08/22/2015   Rash 08/22/2015   History of total hip replacement 05/31/2009   GERD 01/01/2009   SKIN CANCER, HX OF 01/01/2009   COLONIC POLYPS, HX OF 01/01/2009   Granulomatous lung disease ([place]) 01/01/2009   Past Medical History:  Diagnosis Date   GERD (gastroesophageal reflux disease)    Gilbert's syndrome 2010   Other abnormal glucose 2010   Skin cancer     Past Surgical History:  Procedure Laterality Date   colonoscopy with polypectomy     X2 ; hyperplastic   TONSILLECTOMY  AND ADENOIDECTOMY     TOTAL HIP ARTHROPLASTY      Current Outpatient Medications  Medication Sig Dispense Refill Last Dose   aspirin EC 81 MG tablet Take 2 tablets (162 mg total) by mouth daily. 90 tablet 11    Chlorpheniramine Maleate (ALLERGY PO) Take by mouth.      Cyanocobalamin (B-12 PO) Take by mouth.      losartan (COZAAR) 50 MG tablet Take 1 tablet (50 mg total) by mouth daily. 90 tablet 3    silver sulfADIAZINE (SILVADENE) 1 % cream Apply 1 application topically daily. 50 g 1    traMADol (ULTRAM) 50 MG tablet Take 1 tablet (50 mg total) by mouth every 6 (six) hours as needed. 30 tablet 0    VITAMIN D PO Take by mouth.      No current facility-administered medications for this visit.   No Known Allergies  Social History   Tobacco Use   Smoking status: Some Days    Types: Cigars   Smokeless tobacco: Never  Substance Use Topics   Alcohol use: Yes    Alcohol/week: 2.0 standard drinks of alcohol    Types: 2 Glasses of wine per week    Family History  Problem Relation Age of Onset   Diabetes Mother    Lung cancer Brother        2 ppd   Heart disease Neg Hx    Stroke Neg Hx    Colon cancer Neg Hx      Review of Systems  Musculoskeletal:  Positive  for arthralgias.  All other systems reviewed and are negative.   Objective:  Physical Exam Constitutional:      General: He is not in acute distress.    Appearance: Normal appearance.  HENT:     Head: Normocephalic and atraumatic.  Eyes:     Extraocular Movements: Extraocular movements intact.     Pupils: Pupils are equal, round, and reactive to light.  Cardiovascular:     Rate and Rhythm: Normal rate and regular rhythm.     Pulses: Normal pulses.     Heart sounds: Normal heart sounds.  Pulmonary:     Effort: Pulmonary effort is normal. No respiratory distress.     Breath sounds: Normal breath sounds. No wheezing.  Abdominal:     General: Abdomen is flat. Bowel sounds are normal. There is no distension.      Palpations: Abdomen is soft.     Tenderness: There is no abdominal tenderness.  Musculoskeletal:     Cervical back: Normal range of motion and neck supple.     Left knee: Swelling and bony tenderness present. No effusion or erythema. Decreased range of motion. Tenderness present.  Lymphadenopathy:     Cervical: No cervical adenopathy.  Skin:    General: Skin is warm and dry.     Findings: No erythema or rash.  Neurological:     General: No focal deficit present.     Mental Status: He is alert and oriented to person, place, and time.  Psychiatric:        Mood and Affect: Mood normal.        Behavior: Behavior normal.     Vital signs in last 24 hours: '@VSRANGES'$ @  Labs:   Estimated body mass index is 31.54 kg/m as calculated from the following:   Height as of 03/13/22: '6\' 7"'$  (2.007 m).   Weight as of 03/13/22: 127 kg.   Imaging Review Plain radiographs demonstrate moderate degenerative joint disease of the left knee(s). The overall alignment ismild varus. The bone quality appears to be good for age and reported activity level.      Assessment/Plan:  End stage arthritis, left knee   The patient history, physical examination, clinical judgment of the provider and imaging studies are consistent with end stage degenerative joint disease of the left knee(s) and total knee arthroplasty is deemed medically necessary. The treatment options including medical management, injection therapy arthroscopy and arthroplasty were discussed at length. The risks and benefits of total knee arthroplasty were presented and reviewed. The risks due to aseptic loosening, infection, stiffness, patella tracking problems, thromboembolic complications and other imponderables were discussed. The patient acknowledged the explanation, agreed to proceed with the plan and consent was signed. Patient is being admitted for inpatient treatment for surgery, pain control, PT, OT, prophylactic antibiotics, VTE  prophylaxis, progressive ambulation and ADL's and discharge planning. The patient is planning to be discharged  home with outpt PT.     Patient's anticipated LOS is less than 2 midnights, meeting these requirements: - Younger than 28 - Lives within 1 hour of care - Has a competent adult at home to recover with post-op recover - NO history of  - Chronic pain requiring opiods  - Diabetes  - Coronary Artery Disease  - Heart failure  - Heart attack  - Stroke  - DVT/VTE  - Cardiac arrhythmia  - Respiratory Failure/COPD  - Renal failure  - Anemia  - Advanced Liver disease

## 2022-04-17 NOTE — H&P (View-Only) (Signed)
TOTAL KNEE ADMISSION H&P  Patient is being admitted for left total knee arthroplasty.  Subjective:  Chief Complaint:left knee pain.  HPI: Maxwell Hinton, 76 y.o. male, has a history of pain and functional disability in the left knee due to arthritis and has failed non-surgical conservative treatments for greater than 12 weeks to includeNSAID's and/or analgesics and activity modification.  Onset of symptoms was gradual, starting 6 years ago with gradually worsening course since that time. The patient noted no past surgery on the left knee(s).  Patient currently rates pain in the left knee(s) at 8 out of 10 with activity. Patient has night pain, worsening of pain with activity and weight bearing, pain that interferes with activities of daily living, pain with passive range of motion, crepitus, and joint swelling.  Patient has evidence of periarticular osteophytes and joint space narrowing by imaging studies. There is no active infection.  Patient Active Problem List   Diagnosis Date Noted   Smoker 03/16/2022   HLD (hyperlipidemia) 03/16/2022   Preop exam for internal medicine 03/13/2022   Skin lesion of right leg 03/29/2021   Second degree burn of finger, right, initial encounter 12/30/2020   Vertigo 12/30/2020   Nodule of skin of right hand 12/30/2020   Abscess of right axilla 09/06/2019   HTN (hypertension) 05/19/2018   Hyperglycemia 05/13/2017   Encounter for well adult exam with abnormal findings 08/22/2015   Rash 08/22/2015   History of total hip replacement 05/31/2009   GERD 01/01/2009   SKIN CANCER, HX OF 01/01/2009   COLONIC POLYPS, HX OF 01/01/2009   Granulomatous lung disease ([place]) 01/01/2009   Past Medical History:  Diagnosis Date   GERD (gastroesophageal reflux disease)    Gilbert's syndrome 2010   Other abnormal glucose 2010   Skin cancer     Past Surgical History:  Procedure Laterality Date   colonoscopy with polypectomy     X2 ; hyperplastic   TONSILLECTOMY  AND ADENOIDECTOMY     TOTAL HIP ARTHROPLASTY      Current Outpatient Medications  Medication Sig Dispense Refill Last Dose   aspirin EC 81 MG tablet Take 2 tablets (162 mg total) by mouth daily. 90 tablet 11    Chlorpheniramine Maleate (ALLERGY PO) Take by mouth.      Cyanocobalamin (B-12 PO) Take by mouth.      losartan (COZAAR) 50 MG tablet Take 1 tablet (50 mg total) by mouth daily. 90 tablet 3    silver sulfADIAZINE (SILVADENE) 1 % cream Apply 1 application topically daily. 50 g 1    traMADol (ULTRAM) 50 MG tablet Take 1 tablet (50 mg total) by mouth every 6 (six) hours as needed. 30 tablet 0    VITAMIN D PO Take by mouth.      No current facility-administered medications for this visit.   No Known Allergies  Social History   Tobacco Use   Smoking status: Some Days    Types: Cigars   Smokeless tobacco: Never  Substance Use Topics   Alcohol use: Yes    Alcohol/week: 2.0 standard drinks of alcohol    Types: 2 Glasses of wine per week    Family History  Problem Relation Age of Onset   Diabetes Mother    Lung cancer Brother        2 ppd   Heart disease Neg Hx    Stroke Neg Hx    Colon cancer Neg Hx      Review of Systems  Musculoskeletal:  Positive  for arthralgias.  All other systems reviewed and are negative.   Objective:  Physical Exam Constitutional:      General: He is not in acute distress.    Appearance: Normal appearance.  HENT:     Head: Normocephalic and atraumatic.  Eyes:     Extraocular Movements: Extraocular movements intact.     Pupils: Pupils are equal, round, and reactive to light.  Cardiovascular:     Rate and Rhythm: Normal rate and regular rhythm.     Pulses: Normal pulses.     Heart sounds: Normal heart sounds.  Pulmonary:     Effort: Pulmonary effort is normal. No respiratory distress.     Breath sounds: Normal breath sounds. No wheezing.  Abdominal:     General: Abdomen is flat. Bowel sounds are normal. There is no distension.      Palpations: Abdomen is soft.     Tenderness: There is no abdominal tenderness.  Musculoskeletal:     Cervical back: Normal range of motion and neck supple.     Left knee: Swelling and bony tenderness present. No effusion or erythema. Decreased range of motion. Tenderness present.  Lymphadenopathy:     Cervical: No cervical adenopathy.  Skin:    General: Skin is warm and dry.     Findings: No erythema or rash.  Neurological:     General: No focal deficit present.     Mental Status: He is alert and oriented to person, place, and time.  Psychiatric:        Mood and Affect: Mood normal.        Behavior: Behavior normal.     Vital signs in last 24 hours: '@VSRANGES'$ @  Labs:   Estimated body mass index is 31.54 kg/m as calculated from the following:   Height as of 03/13/22: '6\' 7"'$  (2.007 m).   Weight as of 03/13/22: 127 kg.   Imaging Review Plain radiographs demonstrate moderate degenerative joint disease of the left knee(s). The overall alignment ismild varus. The bone quality appears to be good for age and reported activity level.      Assessment/Plan:  End stage arthritis, left knee   The patient history, physical examination, clinical judgment of the provider and imaging studies are consistent with end stage degenerative joint disease of the left knee(s) and total knee arthroplasty is deemed medically necessary. The treatment options including medical management, injection therapy arthroscopy and arthroplasty were discussed at length. The risks and benefits of total knee arthroplasty were presented and reviewed. The risks due to aseptic loosening, infection, stiffness, patella tracking problems, thromboembolic complications and other imponderables were discussed. The patient acknowledged the explanation, agreed to proceed with the plan and consent was signed. Patient is being admitted for inpatient treatment for surgery, pain control, PT, OT, prophylactic antibiotics, VTE  prophylaxis, progressive ambulation and ADL's and discharge planning. The patient is planning to be discharged  home with outpt PT.     Patient's anticipated LOS is less than 2 midnights, meeting these requirements: - Younger than 49 - Lives within 1 hour of care - Has a competent adult at home to recover with post-op recover - NO history of  - Chronic pain requiring opiods  - Diabetes  - Coronary Artery Disease  - Heart failure  - Heart attack  - Stroke  - DVT/VTE  - Cardiac arrhythmia  - Respiratory Failure/COPD  - Renal failure  - Anemia  - Advanced Liver disease

## 2022-04-28 ENCOUNTER — Other Ambulatory Visit (HOSPITAL_COMMUNITY): Payer: Medicare Other

## 2022-05-02 NOTE — Patient Instructions (Signed)
DUE TO COVID-19 ONLY TWO VISITORS  (aged 74 and older)  ARE ALLOWED TO COME WITH YOU AND STAY IN THE WAITING ROOM ONLY DURING PRE OP AND PROCEDURE.   **NO VISITORS ARE ALLOWED IN THE SHORT STAY AREA OR RECOVERY ROOM!!**  IF YOU WILL BE ADMITTED INTO THE HOSPITAL YOU ARE ALLOWED ONLY FOUR SUPPORT PEOPLE DURING VISITATION HOURS ONLY (7 AM -8PM)   The support person(s) must pass our screening, gel in and out, and wear a mask at all times, including in the patient's room. Patients must also wear a mask when staff or their support person are in the room. Visitors GUEST BADGE MUST BE WORN VISIBLY  One adult visitor may remain with you overnight and MUST be in the room by 8 P.M.     Your procedure is scheduled on: 05/09/22   Report to [facility] Main Entrance    Report to admitting at  7:55  AM   Call this number if you have problems the morning of surgery [phone]   Do not eat food :After Midnight.   After Midnight you may have the following liquids until __7:00____ AM/  DAY OF SURGERY  Water Black Coffee (sugar ok, NO MILK/CREAM OR CREAMERS)  Tea (sugar ok, NO MILK/CREAM OR CREAMERS) regular and decaf                             Plain Jell-O (NO RED)                                           Fruit ices (not with fruit pulp, NO RED)                                     Popsicles (NO RED)                                                                  Juice: apple, WHITE grape, WHITE cranberry Sports drinks like Gatorade (NO RED)         The day of surgery:  Drink ONE (1) Pre-Surgery Clear Ensure or at  6:45 AM the morning of surgery. Drink in one sitting. Do not sip.  This drink was given to you during your hospital  pre-op appointment visit. Nothing else to drink after completing the  Pre-Surgery Clear Ensure at 7:00          If you have questions, please contact your surgeon's office.       Oral Hygiene is also important to reduce your risk of infection.                                     Remember - BRUSH YOUR TEETH THE MORNING OF SURGERY WITH YOUR REGULAR TOOTHPASTE   Do NOT smoke after Midnight   Take these medicines the morning of surgery with A SIP OF WATER: none   Before surgery.Stop taking __ASA 81_________on 7/21__________as instructed by _____________.  Stop taking ____________as directed by your Surgeon/Cardiologist.  Contact your Surgeon/Cardiologist for instructions on Anticoagulant Therapy prior to surgery.                                You may not have any metal on your body including jewelry, and body piercing             Do not wear lotions, powders, cologne, or deodorant                Men may shave face and neck.   Do not bring valuables to the hospital. [place].   Contacts, dentures or bridgework may not be worn into surgery.      Patients discharged on the day of surgery will not be allowed to drive home.  Someone NEEDS to stay with you for the first 24 hours after anesthesia.   Special Instructions: Bring a copy of your healthcare power of attorney and living will documents the day of surgery if you haven't scanned them before.              Please read over the following fact sheets you were given: IF YOU HAVE QUESTIONS ABOUT YOUR PRE-OP INSTRUCTIONS PLEASE CALL [phone]     [facility] - Preparing for Surgery Before surgery, you can play an important role.  Because skin is not sterile, your skin needs to be as free of germs as possible.  You can reduce the number of germs on your skin by washing with CHG (chlorahexidine gluconate) soap before surgery.  CHG is an antiseptic cleaner which kills germs and bonds with the skin to continue killing germs even after washing. Please DO NOT use if you have an allergy to CHG or antibacterial soaps.  If your skin becomes reddened/irritated stop using the CHG and inform your nurse when you arrive at Short Stay..  You may  shave your face/neck. Please follow these instructions carefully:  1.  Shower with CHG Soap the night before surgery and the  morning of Surgery.  2.  If you choose to wash your hair, wash your hair first as usual with your  normal  shampoo.  3.  After you shampoo, rinse your hair and body thoroughly to remove the  shampoo.                            4.  Use CHG as you would any other liquid soap.  You can apply chg directly  to the skin and wash                       Gently with a scrungie or clean washcloth.  5.  Apply the CHG Soap to your body ONLY FROM THE NECK DOWN.   Do not use on face/ open                           Wound or open sores. Avoid contact with eyes, ears mouth and genitals (private parts).                       Wash face,  Genitals (private parts) with your normal soap.  6.  Wash thoroughly, paying special attention to the area where your surgery  will be performed.  7.  Thoroughly rinse your body with warm water from the neck down.  8.  DO NOT shower/wash with your normal soap after using and rinsing off  the CHG Soap.                9.  Pat yourself dry with a clean towel.            10.  Wear clean pajamas.            11.  Place clean sheets on your bed the night of your first shower and do not  sleep with pets. Day of Surgery : Do not apply any lotions/deodorants the morning of surgery.  Please wear clean clothes to the hospital/surgery center.  FAILURE TO FOLLOW THESE INSTRUCTIONS MAY RESULT IN THE CANCELLATION OF YOUR SURGERY   ________________________________________________________________________   Incentive Spirometer  An incentive spirometer is a tool that can help keep your lungs clear and active. This tool measures how well you are filling your lungs with each breath. Taking long deep breaths may help reverse or decrease the chance of developing breathing (pulmonary) problems (especially infection) following: A long period of time when you are  unable to move or be active. BEFORE THE PROCEDURE  If the spirometer includes an indicator to show your best effort, your nurse or respiratory therapist will set it to a desired goal. If possible, sit up straight or lean slightly forward. Try not to slouch. Hold the incentive spirometer in an upright position. INSTRUCTIONS FOR USE  Sit on the edge of your bed if possible, or sit up as far as you can in bed or on a chair. Hold the incentive spirometer in an upright position. Breathe out normally. Place the mouthpiece in your mouth and seal your lips tightly around it. Breathe in slowly and as deeply as possible, raising the piston or the ball toward the top of the column. Hold your breath for 3-5 seconds or for as long as possible. Allow the piston or ball to fall to the bottom of the column. Remove the mouthpiece from your mouth and breathe out normally. Rest for a few seconds and repeat Steps 1 through 7 at least 10 times every 1-2 hours when you are awake. Take your time and take a few normal breaths between deep breaths. The spirometer may include an indicator to show your best effort. Use the indicator as a goal to work toward during each repetition. After each set of 10 deep breaths, practice coughing to be sure your lungs are clear. If you have an incision (the cut made at the time of surgery), support your incision when coughing by placing a pillow or rolled up towels firmly against it. Once you are able to get out of bed, walk around indoors and cough well. You may stop using the incentive spirometer when instructed by your caregiver.  RISKS AND COMPLICATIONS Take your time so you do not get dizzy or light-headed. If you are in pain, you may need to take or ask for pain medication before doing incentive spirometry. It is harder to take a deep breath if you are having pain. AFTER USE Rest and breathe slowly and easily. It can be helpful to keep track of a log of your progress. Your  caregiver can provide you with a simple table to help with this. If you are using the spirometer at  home, follow these instructions: [place] IF:  You are having difficultly using the spirometer. You have trouble using the spirometer as often as instructed. Your pain medication is not giving enough relief while using the spirometer. You develop fever of 100.5 F (38.1 C) or higher. SEEK IMMEDIATE MEDICAL CARE IF:  You cough up bloody sputum that had not been present before. You develop fever of 102 F (38.9 C) or greater. You develop worsening pain at or near the incision site. MAKE SURE YOU:  Understand these instructions. Will watch your condition. Will get help right away if you are not doing well or get worse. Document Released: 02/09/2007 Document Revised: 12/22/2011 Document Reviewed: 04/12/2007 [facility] Patient Information 2014 [place], [place].   ________________________________________________________________________

## 2022-05-05 ENCOUNTER — Other Ambulatory Visit (HOSPITAL_COMMUNITY): Payer: Medicare Other

## 2022-05-07 ENCOUNTER — Encounter (HOSPITAL_COMMUNITY)
Admission: RE | Admit: 2022-05-07 | Discharge: 2022-05-07 | Disposition: A | Discharge status: Home / Self Care | Payer: Medicare Other | Source: Ambulatory Visit | Attending: Orthopedic Surgery | Admitting: Orthopedic Surgery

## 2022-05-07 ENCOUNTER — Encounter (HOSPITAL_COMMUNITY): Payer: Self-pay

## 2022-05-07 ENCOUNTER — Other Ambulatory Visit: Payer: Self-pay

## 2022-05-07 DIAGNOSIS — G8929 Other chronic pain: Secondary | ICD-10-CM | POA: Insufficient documentation

## 2022-05-07 DIAGNOSIS — I10 Essential (primary) hypertension: Secondary | ICD-10-CM | POA: Insufficient documentation

## 2022-05-07 DIAGNOSIS — M25562 Pain in left knee: Secondary | ICD-10-CM | POA: Insufficient documentation

## 2022-05-07 DIAGNOSIS — Z01818 Encounter for other preprocedural examination: Secondary | ICD-10-CM | POA: Insufficient documentation

## 2022-05-07 LAB — COMPREHENSIVE METABOLIC PANEL
ALT: 39 U/L (ref 0–44)
AST: 24 U/L (ref 15–41)
Albumin: 4.2 g/dL (ref 3.5–5.0)
Alkaline Phosphatase: 38 U/L (ref 38–126)
Anion gap: 8 (ref 5–15)
BUN: 21 mg/dL (ref 8–23)
CO2: 24 mmol/L (ref 22–32)
Calcium: 9.5 mg/dL (ref 8.9–10.3)
Chloride: 108 mmol/L (ref 98–111)
Creatinine, Ser: 0.81 mg/dL (ref 0.61–1.24)
GFR, Estimated: >60 mL/min (ref >60)
Glucose, Bld: 98 mg/dL (ref 70–99)
Potassium: 4.2 mmol/L (ref 3.5–5.1)
Sodium: 140 mmol/L (ref 135–145)
Total Bilirubin: 0.8 mg/dL (ref 0.3–1.2)
Total Protein: 6.9 g/dL (ref 6.5–8.1)

## 2022-05-07 LAB — CBC WITH DIFFERENTIAL/PLATELET
Abs Immature Granulocytes: 0.01 10*3/uL (ref 0.00–0.07)
Basophils Absolute: 0.1 10*3/uL (ref 0.0–0.1)
Basophils Relative: 1 %
Eosinophils Absolute: 0.1 10*3/uL (ref 0.0–0.5)
Eosinophils Relative: 1 %
HCT: 44.3 % (ref 39.0–52.0)
Hemoglobin: 15.4 g/dL (ref 13.0–17.0)
Immature Granulocytes: 0 %
Lymphocytes Relative: 35 %
Lymphs Abs: 1.8 10*3/uL (ref 0.7–4.0)
MCH: 30.8 pg (ref 26.0–34.0)
MCHC: 34.8 g/dL (ref 30.0–36.0)
MCV: 88.6 fL (ref 80.0–100.0)
Monocytes Absolute: 0.5 10*3/uL (ref 0.1–1.0)
Monocytes Relative: 10 %
Neutro Abs: 2.7 10*3/uL (ref 1.7–7.7)
Neutrophils Relative %: 53 %
Platelets: 250 10*3/uL (ref 150–400)
RBC: 5 MIL/uL (ref 4.22–5.81)
RDW: 12.7 % (ref 11.5–15.5)
WBC: 5.1 10*3/uL (ref 4.0–10.5)
nRBC: 0 % (ref 0.0–0.2)

## 2022-05-07 LAB — SURGICAL PCR SCREEN
MRSA, PCR: NEGATIVE
Staphylococcus aureus: NEGATIVE

## 2022-05-07 NOTE — Progress Notes (Signed)
Anesthesia note:  Bowel prep reminder:NA  PCP - Dr. Marshall Cork Cardiologist -none Other-   Chest x-ray - no EKG - 05/07/22-chart Stress Test - no ECHO -no  Cardiac Cath - NA  Pacemaker/ICD device last checked:NA  Sleep Study - no CPAP -   Pt is pre diabetic-NA Fasting Blood Sugar -  Checks Blood Sugar _____  Blood Thinner:ASA 81 mg/ Dr. Jenny Reichmann Blood Thinner Instructions: Aspirin Instructions:stop 2 weeks prior to DOS/ [place] Last Dose:05/02/22  Anesthesia review: no  Patient denies shortness of breath, fever, cough and chest pain at PAT appointment Pt can climb 2 flights of stairs, do housework and ADLs without SOB.  Patient verbalized understanding of instructions that were given to them at the PAT appointment. Patient was also instructed that they will need to review over the PAT instructions again at home before surgery. yes

## 2022-05-09 ENCOUNTER — Ambulatory Visit (HOSPITAL_BASED_OUTPATIENT_CLINIC_OR_DEPARTMENT_OTHER): Payer: Medicare Other | Admitting: Anesthesiology

## 2022-05-09 ENCOUNTER — Ambulatory Visit (HOSPITAL_COMMUNITY)
Admission: RE | Admit: 2022-05-09 | Discharge: 2022-05-09 | Disposition: A | Discharge status: Home / Self Care | Payer: Medicare Other | Attending: Orthopedic Surgery | Admitting: Orthopedic Surgery

## 2022-05-09 ENCOUNTER — Encounter (HOSPITAL_COMMUNITY): Payer: Self-pay | Admitting: Orthopedic Surgery

## 2022-05-09 ENCOUNTER — Encounter (HOSPITAL_COMMUNITY)
Admission: RE | Disposition: A | Discharge status: Home / Self Care | Payer: Self-pay | Source: Home / Self Care | Attending: Orthopedic Surgery

## 2022-05-09 ENCOUNTER — Ambulatory Visit (HOSPITAL_COMMUNITY): Payer: Medicare Other | Admitting: Anesthesiology

## 2022-05-09 ENCOUNTER — Other Ambulatory Visit: Payer: Self-pay

## 2022-05-09 DIAGNOSIS — I10 Essential (primary) hypertension: Secondary | ICD-10-CM | POA: Insufficient documentation

## 2022-05-09 DIAGNOSIS — M1712 Unilateral primary osteoarthritis, left knee: Secondary | ICD-10-CM | POA: Diagnosis not present

## 2022-05-09 DIAGNOSIS — F17210 Nicotine dependence, cigarettes, uncomplicated: Secondary | ICD-10-CM | POA: Diagnosis not present

## 2022-05-09 DIAGNOSIS — Z96652 Presence of left artificial knee joint: Secondary | ICD-10-CM | POA: Diagnosis not present

## 2022-05-09 DIAGNOSIS — M21162 Varus deformity, not elsewhere classified, left knee: Secondary | ICD-10-CM | POA: Insufficient documentation

## 2022-05-09 DIAGNOSIS — I493 Ventricular premature depolarization: Secondary | ICD-10-CM

## 2022-05-09 DIAGNOSIS — F17290 Nicotine dependence, other tobacco product, uncomplicated: Secondary | ICD-10-CM | POA: Diagnosis not present

## 2022-05-09 DIAGNOSIS — R9431 Abnormal electrocardiogram [ECG] [EKG]: Secondary | ICD-10-CM

## 2022-05-09 DIAGNOSIS — G8918 Other acute postprocedural pain: Secondary | ICD-10-CM | POA: Diagnosis not present

## 2022-05-09 DIAGNOSIS — Z01818 Encounter for other preprocedural examination: Secondary | ICD-10-CM

## 2022-05-09 DIAGNOSIS — F17200 Nicotine dependence, unspecified, uncomplicated: Secondary | ICD-10-CM | POA: Diagnosis not present

## 2022-05-09 HISTORY — PX: TOTAL KNEE ARTHROPLASTY: SHX125

## 2022-05-09 LAB — ABO/RH: ABO/RH(D): A POS

## 2022-05-09 LAB — TYPE AND SCREEN
ABO/RH(D): A POS
Antibody Screen: NEGATIVE

## 2022-05-09 SURGERY — ARTHROPLASTY, KNEE, TOTAL
Anesthesia: Spinal | Site: Knee | Laterality: Left

## 2022-05-09 MED ORDER — TRANEXAMIC ACID-NACL 1000-0.7 MG/100ML-% IV SOLN
1000.0000 mg | INTRAVENOUS | Status: AC
  Administered 2022-05-09: 1000 mg via INTRAVENOUS
  Filled 2022-05-09: qty 100

## 2022-05-09 MED ORDER — SODIUM CHLORIDE 0.9 % IV SOLN: INTRAVENOUS | Status: DC

## 2022-05-09 MED ORDER — HYDROMORPHONE HCL 1 MG/ML IJ SOLN: 0.2500 mg | INTRAMUSCULAR | Status: DC | PRN

## 2022-05-09 MED ORDER — BUPIVACAINE LIPOSOME 1.3 % IJ SUSP: 20.0000 mL | Freq: Once | INTRAMUSCULAR | Status: DC

## 2022-05-09 MED ORDER — LACTATED RINGERS IV BOLUS
250.0000 mL | Freq: Once | INTRAVENOUS | Status: AC
  Administered 2022-05-09: 250 mL via INTRAVENOUS

## 2022-05-09 MED ORDER — OXYCODONE HCL 5 MG PO TABS: ORAL_TABLET | ORAL | 0 refills | Status: DC

## 2022-05-09 MED ORDER — SODIUM CHLORIDE 0.9 % IV SOLN
INTRAVENOUS | Status: DC | PRN
  Administered 2022-05-09: 1000 mL

## 2022-05-09 MED ORDER — SODIUM CHLORIDE 0.9% FLUSH
INTRAVENOUS | Status: DC | PRN
  Administered 2022-05-09: 20 mL via INTRAVENOUS

## 2022-05-09 MED ORDER — CEFAZOLIN IN SODIUM CHLORIDE 3-0.9 GM/100ML-% IV SOLN
3.0000 g | INTRAVENOUS | Status: AC
  Administered 2022-05-09: 3 g via INTRAVENOUS
  Filled 2022-05-09: qty 100

## 2022-05-09 MED ORDER — BUPIVACAINE-EPINEPHRINE (PF) 0.25% -1:200000 IJ SOLN
INTRAMUSCULAR | Status: AC
  Filled 2022-05-09: qty 30

## 2022-05-09 MED ORDER — FENTANYL CITRATE PF 50 MCG/ML IJ SOSY
50.0000 ug | PREFILLED_SYRINGE | INTRAMUSCULAR | Status: AC
  Administered 2022-05-09: 50 ug via INTRAVENOUS
  Filled 2022-05-09: qty 2

## 2022-05-09 MED ORDER — DOCUSATE SODIUM 100 MG PO CAPS: 100.0000 mg | ORAL_CAPSULE | Freq: Every day | ORAL | 2 refills | Status: AC | PRN

## 2022-05-09 MED ORDER — BUPIVACAINE-EPINEPHRINE (PF) 0.5% -1:200000 IJ SOLN
INTRAMUSCULAR | Status: DC | PRN
  Administered 2022-05-09: 25 mL via PERINEURAL

## 2022-05-09 MED ORDER — BUPIVACAINE LIPOSOME 1.3 % IJ SUSP
INTRAMUSCULAR | Status: DC | PRN
  Administered 2022-05-09: 20 mL

## 2022-05-09 MED ORDER — ACETAMINOPHEN 500 MG PO TABS
1000.0000 mg | ORAL_TABLET | Freq: Once | ORAL | Status: AC
  Administered 2022-05-09: 1000 mg via ORAL
  Filled 2022-05-09: qty 2

## 2022-05-09 MED ORDER — PHENYLEPHRINE HCL-NACL 20-0.9 MG/250ML-% IV SOLN
INTRAVENOUS | Status: DC | PRN
  Administered 2022-05-09: 50 ug/min via INTRAVENOUS

## 2022-05-09 MED ORDER — BUPIVACAINE-EPINEPHRINE (PF) 0.25% -1:200000 IJ SOLN
INTRAMUSCULAR | Status: DC | PRN
  Administered 2022-05-09: 20 mL via PERINEURAL

## 2022-05-09 MED ORDER — FENTANYL CITRATE (PF) 100 MCG/2ML IJ SOLN
INTRAMUSCULAR | Status: AC
  Filled 2022-05-09: qty 2

## 2022-05-09 MED ORDER — LIDOCAINE HCL (CARDIAC) PF 100 MG/5ML IV SOSY
PREFILLED_SYRINGE | INTRAVENOUS | Status: DC | PRN
  Administered 2022-05-09: 20 mg via INTRATRACHEAL

## 2022-05-09 MED ORDER — TRANEXAMIC ACID-NACL 1000-0.7 MG/100ML-% IV SOLN: 1000.0000 mg | Freq: Once | INTRAVENOUS | Status: DC

## 2022-05-09 MED ORDER — ONDANSETRON HCL 4 MG/2ML IJ SOLN
INTRAMUSCULAR | Status: DC | PRN
  Administered 2022-05-09: 4 mg via INTRAVENOUS

## 2022-05-09 MED ORDER — SODIUM CHLORIDE 0.9 % IR SOLN
Status: DC | PRN
  Administered 2022-05-09: 1000 mL

## 2022-05-09 MED ORDER — BUPIVACAINE IN DEXTROSE 0.75-8.25 % IT SOLN
INTRATHECAL | Status: DC | PRN
  Administered 2022-05-09: 2 mL via INTRATHECAL

## 2022-05-09 MED ORDER — CELECOXIB 200 MG PO CAPS: 200.0000 mg | ORAL_CAPSULE | Freq: Two times a day (BID) | ORAL | 0 refills | Status: AC

## 2022-05-09 MED ORDER — TRANEXAMIC ACID 1000 MG/10ML IV SOLN
2000.0000 mg | INTRAVENOUS | Status: DC
  Filled 2022-05-09: qty 20

## 2022-05-09 MED ORDER — LACTATED RINGERS IV SOLN: INTRAVENOUS | Status: DC

## 2022-05-09 MED ORDER — BUPIVACAINE LIPOSOME 1.3 % IJ SUSP
INTRAMUSCULAR | Status: AC
  Filled 2022-05-09: qty 20

## 2022-05-09 MED ORDER — PHENYLEPHRINE HCL-NACL 20-0.9 MG/250ML-% IV SOLN
INTRAVENOUS | Status: AC
  Filled 2022-05-09: qty 250

## 2022-05-09 MED ORDER — ASPIRIN EC 81 MG PO TBEC: 81.0000 mg | DELAYED_RELEASE_TABLET | Freq: Two times a day (BID) | ORAL | 0 refills | Status: AC

## 2022-05-09 MED ORDER — CHLORHEXIDINE GLUCONATE 0.12 % MT SOLN
15.0000 mL | Freq: Once | OROMUCOSAL | Status: AC
  Administered 2022-05-09: 15 mL via OROMUCOSAL

## 2022-05-09 MED ORDER — LACTATED RINGERS IV BOLUS
500.0000 mL | Freq: Once | INTRAVENOUS | Status: AC
  Administered 2022-05-09: 500 mL via INTRAVENOUS

## 2022-05-09 MED ORDER — MIDAZOLAM HCL 2 MG/2ML IJ SOLN
1.0000 mg | INTRAMUSCULAR | Status: AC
  Administered 2022-05-09: 2 mg via INTRAVENOUS
  Filled 2022-05-09: qty 2

## 2022-05-09 MED ORDER — SODIUM CHLORIDE (PF) 0.9 % IJ SOLN
INTRAMUSCULAR | Status: AC
  Filled 2022-05-09: qty 20

## 2022-05-09 MED ORDER — PROPOFOL 500 MG/50ML IV EMUL
INTRAVENOUS | Status: DC | PRN
  Administered 2022-05-09: 80 ug/kg/min via INTRAVENOUS

## 2022-05-09 MED ORDER — PROPOFOL 1000 MG/100ML IV EMUL
INTRAVENOUS | Status: AC
  Filled 2022-05-09: qty 100

## 2022-05-09 MED ORDER — GLYCOPYRROLATE PF 0.2 MG/ML IJ SOSY
PREFILLED_SYRINGE | INTRAMUSCULAR | Status: DC | PRN
  Administered 2022-05-09: .2 mg via INTRAVENOUS

## 2022-05-09 MED ORDER — POVIDONE-IODINE 10 % EX SWAB
2.0000 | Freq: Once | CUTANEOUS | Status: AC
  Administered 2022-05-09: 2 via TOPICAL

## 2022-05-09 MED ORDER — TRANEXAMIC ACID 1000 MG/10ML IV SOLN
INTRAVENOUS | Status: DC | PRN
  Administered 2022-05-09: 2000 mg via TOPICAL

## 2022-05-09 MED ORDER — WATER FOR IRRIGATION, STERILE IR SOLN
Status: DC | PRN
  Administered 2022-05-09: 2000 mL

## 2022-05-09 MED ORDER — FENTANYL CITRATE (PF) 100 MCG/2ML IJ SOLN
INTRAMUSCULAR | Status: DC | PRN
  Administered 2022-05-09 (×2): 50 ug via INTRAVENOUS

## 2022-05-09 MED ORDER — PROPOFOL 10 MG/ML IV BOLUS
INTRAVENOUS | Status: DC | PRN
  Administered 2022-05-09: 30 mg via INTRAVENOUS
  Administered 2022-05-09: 20 mg via INTRAVENOUS

## 2022-05-09 MED ORDER — ORAL CARE MOUTH RINSE: 15.0000 mL | Freq: Once | OROMUCOSAL | Status: AC

## 2022-05-09 MED ORDER — DEXAMETHASONE SODIUM PHOSPHATE 10 MG/ML IJ SOLN
INTRAMUSCULAR | Status: DC | PRN
  Administered 2022-05-09: 8 mg via INTRAVENOUS

## 2022-05-09 SURGICAL SUPPLY — 57 items
ATTUNE MED DOME PAT 41 KNEE (Knees) ×1 IMPLANT
ATTUNE PS FEM LT SZ 8 CEM KNEE (Femur) ×1 IMPLANT
ATTUNE PSRP INSR SZ8 6 KNEE (Insert) ×1 IMPLANT
BAG COUNTER SPONGE SURGICOUNT (BAG) ×2 IMPLANT
BAG DECANTER FOR FLEXI CONT (MISCELLANEOUS) ×2 IMPLANT
BAG ZIPLOCK 12X15 (MISCELLANEOUS) ×2 IMPLANT
BASE TIBIAL ATTUNE KNEE SZ9 (Knees) IMPLANT
BLADE SAGITTAL 25.0X1.19X90 (BLADE) ×2 IMPLANT
BLADE SAW SGTL 13X75X1.27 (BLADE) ×2 IMPLANT
BLADE SURG 15 STRL LF DISP TIS (BLADE) ×1 IMPLANT
BLADE SURG 15 STRL SS (BLADE) ×2
BLADE SURG SZ10 CARB STEEL (BLADE) ×4 IMPLANT
BNDG ELASTIC 6X15 VLCR STRL LF (GAUZE/BANDAGES/DRESSINGS) ×2 IMPLANT
BOWL SMART MIX CTS (DISPOSABLE) ×2 IMPLANT
CEMENT HV SMART SET (Cement) ×2 IMPLANT
CLSR STERI-STRIP ANTIMIC 1/2X4 (GAUZE/BANDAGES/DRESSINGS) ×3 IMPLANT
COVER SURGICAL LIGHT HANDLE (MISCELLANEOUS) ×2 IMPLANT
CUFF TOURN SGL QUICK 34 (TOURNIQUET CUFF) ×2
CUFF TRNQT CYL 34X4.125X (TOURNIQUET CUFF) ×1 IMPLANT
DRAPE INCISE IOBAN 66X45 STRL (DRAPES) ×2 IMPLANT
DRAPE U-SHAPE 47X51 STRL (DRAPES) ×2 IMPLANT
DRESSING AQUACEL AG SP 3.5X10 (GAUZE/BANDAGES/DRESSINGS) ×1 IMPLANT
DRSG AQUACEL AG ADV 3.5X10 (GAUZE/BANDAGES/DRESSINGS) ×1 IMPLANT
DRSG AQUACEL AG SP 3.5X10 (GAUZE/BANDAGES/DRESSINGS) ×2
DURAPREP 26ML APPLICATOR (WOUND CARE) ×4 IMPLANT
ELECT REM PT RETURN 15FT ADLT (MISCELLANEOUS) ×2 IMPLANT
GLOVE BIOGEL PI IND STRL 8 (GLOVE) ×2 IMPLANT
GLOVE BIOGEL PI INDICATOR 8 (GLOVE) ×2
GLOVE SURG ORTHO 8.0 STRL STRW (GLOVE) ×2 IMPLANT
GLOVE SURG POLYISO LF SZ7.5 (GLOVE) ×2 IMPLANT
GOWN STRL REUS W/ TWL XL LVL3 (GOWN DISPOSABLE) ×2 IMPLANT
GOWN STRL REUS W/TWL XL LVL3 (GOWN DISPOSABLE) ×4
HANDPIECE INTERPULSE COAX TIP (DISPOSABLE) ×2
HOLDER FOLEY CATH W/STRAP (MISCELLANEOUS) IMPLANT
HOOD PEEL AWAY FLYTE STAYCOOL (MISCELLANEOUS) ×2 IMPLANT
IMMOBILIZER KNEE 20 (SOFTGOODS) ×2
IMMOBILIZER KNEE 20 THIGH 36 (SOFTGOODS) ×1 IMPLANT
KIT TURNOVER KIT A (KITS) IMPLANT
MANIFOLD NEPTUNE II (INSTRUMENTS) ×2 IMPLANT
NEEDLE HYPO 22GX1.5 SAFETY (NEEDLE) ×4 IMPLANT
NS IRRIG 1000ML POUR BTL (IV SOLUTION) ×2 IMPLANT
PACK TOTAL KNEE CUSTOM (KITS) ×2 IMPLANT
PROTECTOR NERVE ULNAR (MISCELLANEOUS) ×2 IMPLANT
SET HNDPC FAN SPRY TIP SCT (DISPOSABLE) ×1 IMPLANT
SPIKE FLUID TRANSFER (MISCELLANEOUS) ×4 IMPLANT
SUT ETHIBOND NAB CT1 #1 30IN (SUTURE) ×4 IMPLANT
SUT MNCRL AB 3-0 PS2 18 (SUTURE) ×2 IMPLANT
SUT VIC AB 0 CT1 36 (SUTURE) ×2 IMPLANT
SUT VIC AB 2-0 CT1 27 (SUTURE) ×4
SUT VIC AB 2-0 CT1 TAPERPNT 27 (SUTURE) ×2 IMPLANT
SYR CONTROL 10ML LL (SYRINGE) ×6 IMPLANT
TIBIAL BASE ATTUNE KNEE SZ9 (Knees) ×2 IMPLANT
TOWEL OR 17X26 10 PK STRL BLUE (TOWEL DISPOSABLE) ×2 IMPLANT
TRAY FOLEY MTR SLVR 16FR STAT (SET/KITS/TRAYS/PACK) ×2 IMPLANT
TUBE SUCTION HIGH CAP CLEAR NV (SUCTIONS) ×2 IMPLANT
WATER STERILE IRR 1000ML POUR (IV SOLUTION) ×4 IMPLANT
WRAP KNEE MAXI GEL POST OP (GAUZE/BANDAGES/DRESSINGS) ×2 IMPLANT

## 2022-05-09 NOTE — Transfer of Care (Signed)
Immediate Anesthesia Transfer of Care Note  Patient: Maxwell Hinton  Procedure(s) Performed: TOTAL KNEE ARTHROPLASTY (Left: Knee)  Patient Location: PACU  Anesthesia Type:Spinal and MAC combined with regional for post-op pain  Level of Consciousness: awake, alert , oriented and patient cooperative  Airway & Oxygen Therapy: Patient Spontanous Breathing and Patient connected to face mask oxygen  Post-op Assessment: Report given to RN and Post -op Vital signs reviewed and stable  Post vital signs: Reviewed and stable  Last Vitals:  Vitals Value Taken Time  BP 118/66 05/09/22 1347  Temp    Pulse 71 05/09/22 1350  Resp 19 05/09/22 1350  SpO2 93 % 05/09/22 1350  Vitals shown include unvalidated device data.  Last Pain:  Vitals:   05/09/22 1020  PainSc: 0-No pain         Complications: No notable events documented.

## 2022-05-09 NOTE — Anesthesia Procedure Notes (Signed)
Spinal  Patient location during procedure: OR Start time: 05/09/2022 11:25 AM End time: 05/09/2022 11:28 AM Reason for block: surgical anesthesia Staffing Resident/CRNA: Cleda Daub, CRNA Performed by: Cleda Daub, CRNA Authorized by: Pervis Hocking, DO   Preanesthetic Checklist Completed: patient identified, IV checked, site marked, risks and benefits discussed, surgical consent, monitors and equipment checked, pre-op evaluation and timeout performed Spinal Block Patient position: sitting Prep: DuraPrep Patient monitoring: heart rate, cardiac monitor, continuous pulse ox and blood pressure Approach: midline Location: L3-4 Injection technique: single-shot Needle Needle type: Pencan  Needle gauge: 24 G Needle length: 10 cm Assessment Sensory level: T4 Events: CSF return Additional Notes Full monitoring, O2 via FM; checked the expiration dates on spinal kit and dura prep; maintained sterile technique throughout the procedure; anesthetized skin with lidocaine SQ; saw clear CSF before injection and clear swirl after injection; pt tolerated well.

## 2022-05-09 NOTE — Evaluation (Signed)
Physical Therapy Evaluation Patient Details Name: Kymari Nuon MRN: [id number] DOB: September 15, 1946 Today's Date: 05/09/2022  History of Present Illness  Pt is a 76yo male presenting s/p L-TKA on 05/09/22. PMH: GERD, R hip surfacing 2010, HLD, smoker, vertigo, HTN,  Clinical Impression  Daviyon Widmayer is a 76 y.o. male POD 0 s/p L-TKA. Patient reports IND with mobility at baseline. Patient is now limited by functional impairments (see PT problem list below) and requires min guard for transfers and gait with RW. Patient was able to ambulate 90 feet with RW and min guard and cues for safe walker management. Patient educated on safe sequencing for stair mobility and verbalized safe guarding position for people assisting with mobility. Patient instructed in exercises to facilitate ROM and circulation. Patient will benefit from continued skilled PT interventions to address impairments and progress towards PLOF. Patient has met mobility goals at adequate level for discharge home; will continue to follow if pt continues acute stay to progress towards Mod I goals.        Recommendations for follow up therapy are one component of a multi-disciplinary discharge planning process, led by the attending physician.  Recommendations may be updated based on patient status, additional functional criteria and insurance authorization.  Follow Up Recommendations Follow physician's recommendations for discharge plan and follow up therapies      Assistance Recommended at Discharge Intermittent Supervision/Assistance  Patient can return home with the following  A little help with walking and/or transfers;A little help with bathing/dressing/bathroom;Assistance with cooking/housework;Assist for transportation;Help with stairs or ramp for entrance    Equipment Recommendations None recommended by PT  Recommendations for Other Services       Functional Status Assessment Patient has had a recent decline in their  functional status and demonstrates the ability to make significant improvements in function in a reasonable and predictable amount of time.     Precautions / Restrictions Precautions Precautions: None Restrictions Weight Bearing Restrictions: No Other Position/Activity Restrictions: wbat      Mobility  Bed Mobility Overal bed mobility: Independent                  Transfers Overall transfer level: Needs assistance Equipment used: Rolling walker (2 wheels) Transfers: Sit to/from Stand Sit to Stand: Min guard           General transfer comment: For safety only    Ambulation/Gait Ambulation/Gait assistance: Min guard Gait Distance (Feet): 90 Feet Assistive device: Rolling walker (2 wheels) Gait Pattern/deviations: Step-to pattern, Step-through pattern Gait velocity: decreased     General Gait Details: Pt ambulated with RW and min guard, no physical assist required or overt LOB noted.  Stairs Stairs: Yes Stairs assistance: Min guard Stair Management: One rail Left, Step to pattern, Sideways Number of Stairs: 2 General stair comments: Pt demonstrated safe technique with minimal cuing, no overt LOB  Wheelchair Mobility    Modified Rankin (Stroke Patients Only)       Balance Overall balance assessment: Needs assistance Sitting-balance support: Feet supported, No upper extremity supported Sitting balance-Leahy Scale: Good     Standing balance support: Reliant on assistive device for balance, During functional activity, Bilateral upper extremity supported Standing balance-Leahy Scale: Poor                               Pertinent Vitals/Pain Pain Assessment Pain Assessment: No/denies pain    Home Living Family/patient expects to be discharged to:: Private  residence Living Arrangements: Spouse/significant other Available Help at Discharge: Family;Available 24 hours/day Type of Home: House Home Access: Stairs to enter Entrance  Stairs-Rails: None Entrance Stairs-Number of Steps: 2 Alternate Level Stairs-Number of Steps: 14 Home Layout: Two level;Bed/bath upstairs Home Equipment: Conservation officer, nature (2 wheels);Crutches;Cane - single point;Grab bars - tub/shower      Prior Function Prior Level of Function : Independent/Modified Independent;Driving             Mobility Comments: ind ADLs Comments: ind     Hand Dominance        Extremity/Trunk Assessment   Upper Extremity Assessment Upper Extremity Assessment: Overall WFL for tasks assessed    Lower Extremity Assessment Lower Extremity Assessment: RLE deficits/detail;LLE deficits/detail RLE Deficits / Details: MMT ank DF/PF 5/5 RLE Sensation: WNL LLE Deficits / Details: MMT ank DF/PF 5/5, no extensor lag noted LLE Sensation: WNL    Cervical / Trunk Assessment Cervical / Trunk Assessment: Normal  Communication   Communication: No difficulties  Cognition Arousal/Alertness: Awake/alert Behavior During Therapy: WFL for tasks assessed/performed Overall Cognitive Status: Within Functional Limits for tasks assessed                                          General Comments General comments (skin integrity, edema, etc.): Wife present for session    Exercises Total Joint Exercises Ankle Circles/Pumps: AROM, Both, 10 reps Quad Sets: AROM, Left, 5 reps Short Arc Quad: AROM, Left, 5 reps Heel Slides: AROM, Left, 5 reps Hip ABduction/ADduction: AROM, Left, 5 reps Straight Leg Raises: AROM, Left, 5 reps   Assessment/Plan    PT Assessment Patient needs continued PT services  PT Problem List Decreased strength;Decreased range of motion;Decreased activity tolerance;Decreased balance;Decreased mobility;Decreased coordination;Decreased knowledge of use of DME;Decreased safety awareness;Pain       PT Treatment Interventions Gait training;DME instruction;Stair training;Functional mobility training;Therapeutic activities;Therapeutic  exercise;Balance training;Neuromuscular re-education;Patient/family education    PT Goals (Current goals can be found in the Care Plan section)  Acute Rehab PT Goals Patient Stated Goal: To go cabin in the [place] in 2weeks PT Goal Formulation: With patient Time For Goal Achievement: 05/16/22 Potential to Achieve Goals: Good    Frequency 7X/week     Co-evaluation               AM-PAC PT "6 Clicks" Mobility  Outcome Measure Help needed turning from your back to your side while in a flat bed without using bedrails?: None Help needed moving from lying on your back to sitting on the side of a flat bed without using bedrails?: None Help needed moving to and from a bed to a chair (including a wheelchair)?: A Little Help needed standing up from a chair using your arms (e.g., wheelchair or bedside chair)?: A Little Help needed to walk in hospital room?: A Little Help needed climbing 3-5 steps with a railing? : A Little 6 Click Score: 20    End of Session Equipment Utilized During Treatment: Gait belt;Left knee immobilizer Activity Tolerance: Patient tolerated treatment well;No increased pain Patient left: in chair;with call bell/phone within reach;with family/visitor present Nurse Communication: Mobility status PT Visit Diagnosis: Difficulty in walking, not elsewhere classified (R26.2)    Time: 6629-4765 PT Time Calculation (min) (ACUTE ONLY): 37 min   Charges:   PT Evaluation $PT Eval Low Complexity: 1 Low PT Treatments $Gait Training: 8-22 mins  Coolidge Breeze, PT, DPT [facility] Department Office: [phone] Pager: [phone]  Coolidge Breeze 05/09/2022, 5:07 PM

## 2022-05-09 NOTE — Anesthesia Procedure Notes (Addendum)
  Anesthesia Regional Block: Adductor canal block   Pre-Anesthetic Checklist: , timeout performed,  Correct Patient, Correct Site, Correct Laterality,  Correct Procedure, Correct Position, site marked,  Risks and benefits discussed,  Surgical consent,  Pre-op evaluation,  At surgeon's request and post-op pain management  Laterality: Left  Prep: chloraprep       Needles:   Needle Type: Echogenic Stimulator Needle          Additional Needles:   Narrative:  Start time: 05/09/2022 9:40 AM End time: 05/09/2022 9:55 AM Injection made incrementally with aspirations every 5 mL.  Performed by: Other  Anesthesiologist: Belinda Block, MD

## 2022-05-09 NOTE — Progress Notes (Signed)
Orthopedic Tech Progress Note Patient Details:  Maxwell Hinton 05-10-1946 [id number]  Ortho Devices Type of Ortho Device: Bone foam zero knee Ortho Device/Splint Interventions: Ordered      Linus Salmons [name] 05/09/2022, 2:43 PM

## 2022-05-09 NOTE — Op Note (Unsigned)
NAME: SRIHITH, AQUILINO MEDICAL RECORD NO: [id number] ACCOUNT NO: [id number] DATE OF BIRTH: 1946/01/03 FACILITY: [facility] LOCATION: [facility] PHYSICIAN: W D. Valeta Harms., MD  Operative Report   DATE OF PROCEDURE: 05/09/2022  PREOPERATIVE DIAGNOSIS:  Severe osteoarthritis, left knee with varus deformity.  POSTOPERATIVE DIAGNOSIS:  Severe osteoarthritis, left knee with varus deformity.  PROCEDURE:  Left total knee replacement (Attune cemented knee, size 8 femur, size 8 mm x 6 mm thick tibial bearing with a size 9 tibia and 41 mm all poly patella).  SURGEON: Yvette Rack., MD  ASSISTANTShirlean Schlein, PA.  ANESTHESIA:  Spinal with block.  DESCRIPTION OF PROCEDURE:  Thigh tourniquet inflated to 350.  Exsanguination of the leg.  Straight skin incision with medial parapatellar approach to the knee made.  We did a 5-degree, 10 mm valgus cut on the distal femur, followed by cutting about 3 mm  below the most diseased medial compartment with the extension gap being measured at 6 mm.  We stripped the medial side of the knee due to the moderate varus deformity.  Release of the PCL.  Femur was sized to be a size 8.  Placement of all-in-one cutting  block in the appropriate degree of external rotation, accomplishing the anterior, posterior chamfer cuts.  We removed posterior osteophytes from the knee and infiltrated the subcutaneous and capsular tissues with a mixture of Marcaine and Exparel.   Tibia was sized to be a size 9, placement of the keel cut for the tibia with the tibial trial and the box cut on the femur.  Patella was cut, resecting 9.5 mm of patella.  Placement of a 41 mm trial.  We trialled with the 5 mm and 6 mm bearing.  The 6 mm  gave the best combination of stability, plus full extension.  Cement was prepared on the back table.  We inserted the final components of the tibia, followed by femur, patella.  Cement was allowed to harden with the trial bearing in.  Trial bearing was  removed.   Small bits of cement were removed from the posterior aspect of the knee.  Tourniquet was released under direct vision.  No excessive bleeding was noted.  Small bleeders were coagulated.  Final bearing was placed and all stability parameters  were deemed to be acceptable.  Closure was affected on the capsule with #1 Ethibond with subcutaneous tissues with Vicryl and the skin with Monocryl.  He was taken to recovery room in stable condition.   MUK D: 05/09/2022 1:12:42 pm T: 05/09/2022 11:01:00 pm  JOB: [id number]/ [id number]

## 2022-05-09 NOTE — Interval H&P Note (Signed)
History and Physical Interval Note:  05/09/2022 9:50 AM  Maxwell Hinton  has presented today for surgery, with the diagnosis of OA LEFT KNEE.  The various methods of treatment have been discussed with the patient and family. After consideration of risks, benefits and other options for treatment, the patient has consented to  Procedure(s): TOTAL KNEE ARTHROPLASTY (Left) as a surgical intervention.  The patient's history has been reviewed, patient examined, no change in status, stable for surgery.  I have reviewed the patient's chart and labs.  Questions were answered to the patient's satisfaction.     Yvette Rack

## 2022-05-09 NOTE — Discharge Instructions (Signed)

## 2022-05-09 NOTE — Anesthesia Preprocedure Evaluation (Addendum)
Anesthesia Evaluation  Patient identified by MRN, date of birth, ID band Patient awake    Reviewed: Allergy & Precautions, NPO status   Airway Mallampati: II  TM Distance: >3 FB     Dental   Pulmonary Current Smoker and Patient abstained from smoking.,    breath sounds clear to auscultation       Cardiovascular hypertension,  Rhythm:Regular Rate:Normal     Neuro/Psych    GI/Hepatic Neg liver ROS, GERD  ,  Endo/Other  negative endocrine ROS  Renal/GU negative Renal ROS     Musculoskeletal   Abdominal   Peds  Hematology   Anesthesia Other Findings   Reproductive/Obstetrics                             Anesthesia Physical Anesthesia Plan  ASA: 3  Anesthesia Plan: Spinal   Post-op Pain Management: Regional block*   Induction: Intravenous  PONV Risk Score and Plan: Ondansetron, Dexamethasone and Midazolam  Airway Management Planned: Nasal Cannula and Simple Face Mask  Additional Equipment:   Intra-op Plan:   Post-operative Plan:   Informed Consent: I have reviewed the patients History and Physical, chart, labs and discussed the procedure including the risks, benefits and alternatives for the proposed anesthesia with the patient or authorized representative who has indicated his/her understanding and acceptance.     Dental advisory given  Plan Discussed with: Anesthesiologist and CRNA  Anesthesia Plan Comments:        Anesthesia Quick Evaluation

## 2022-05-09 NOTE — Brief Op Note (Signed)
05/09/2022  1:42 PM  PATIENT:  Maxwell Hinton  76 y.o. male  PRE-OPERATIVE DIAGNOSIS:  OA LEFT KNEE  POST-OPERATIVE DIAGNOSIS:  OA LEFT KNEE  PROCEDURE:  Procedure(s): TOTAL KNEE ARTHROPLASTY (Left)  SURGEON:  Surgeon(s) and Role:    Earlie Server, MD - Primary  PHYSICIAN ASSISTANT: Chriss Czar, PA-C  ASSISTANTS: OR staff x1   ANESTHESIA:   local, regional, spinal, and IV sedation  EBL:  75 mL   BLOOD ADMINISTERED:none  DRAINS: none   LOCAL MEDICATIONS USED:  MARCAINE     SPECIMEN:  No Specimen  DISPOSITION OF SPECIMEN:  N/A  COUNTS:  YES  TOURNIQUET:   Total Tourniquet Time Documented: Thigh (Left) - 73 minutes Total: Thigh (Left) - 73 minutes   DICTATION: .Other Dictation: Dictation Number unknown  PLAN OF CARE: Discharge to home after PACU  PATIENT DISPOSITION:  PACU - hemodynamically stable.   Delay start of Pharmacological VTE agent (>24hrs) due to surgical blood loss or risk of bleeding: yes

## 2022-05-12 ENCOUNTER — Encounter (HOSPITAL_COMMUNITY): Payer: Self-pay | Admitting: Orthopedic Surgery

## 2022-05-12 NOTE — Anesthesia Postprocedure Evaluation (Signed)
Anesthesia Post Note  Patient: Maxwell Hinton  Procedure(s) Performed: TOTAL KNEE ARTHROPLASTY (Left: Knee)     Patient location during evaluation: PACU Anesthesia Type: Spinal, MAC and Regional Level of consciousness: awake and alert Pain management: pain level controlled Vital Signs Assessment: post-procedure vital signs reviewed and stable Respiratory status: spontaneous breathing, nonlabored ventilation, respiratory function stable and patient connected to nasal cannula oxygen Cardiovascular status: stable and blood pressure returned to baseline Postop Assessment: no apparent nausea or vomiting Anesthetic complications: no   No notable events documented.  Last Vitals:  Vitals:   05/09/22 1615 05/09/22 1715  BP: 133/63 126/77  Pulse: 78 72  Resp: 13 13  Temp:    SpO2: 94% 96%    Last Pain:  Vitals:   05/09/22 1715  PainSc: 0-No pain                 Belenda Cruise P Karmela Bram

## 2022-05-13 DIAGNOSIS — Z96652 Presence of left artificial knee joint: Secondary | ICD-10-CM | POA: Diagnosis not present

## 2022-05-15 DIAGNOSIS — Z96652 Presence of left artificial knee joint: Secondary | ICD-10-CM | POA: Diagnosis not present

## 2022-05-20 DIAGNOSIS — Z96652 Presence of left artificial knee joint: Secondary | ICD-10-CM | POA: Diagnosis not present

## 2022-05-22 DIAGNOSIS — M1712 Unilateral primary osteoarthritis, left knee: Secondary | ICD-10-CM | POA: Diagnosis not present

## 2022-05-22 DIAGNOSIS — Z96652 Presence of left artificial knee joint: Secondary | ICD-10-CM | POA: Diagnosis not present

## 2022-06-02 NOTE — Addendum Note (Signed)
Addended by: Max Sane on: 06/02/2022 09:42 AM   Modules accepted: Orders

## 2022-06-09 ENCOUNTER — Other Ambulatory Visit (HOSPITAL_COMMUNITY): Payer: Medicare Other

## 2022-06-09 ENCOUNTER — Ambulatory Visit (INDEPENDENT_AMBULATORY_CARE_PROVIDER_SITE_OTHER): Payer: Medicare Other

## 2022-06-09 DIAGNOSIS — Z0000 Encounter for general adult medical examination without abnormal findings: Secondary | ICD-10-CM | POA: Diagnosis not present

## 2022-06-09 NOTE — Patient Instructions (Signed)
Maxwell Hinton , Thank you for taking time to come for your Medicare Wellness Visit. I appreciate your ongoing commitment to your health goals. Please review the following plan we discussed and let me know if I can assist you in the future.   Screening recommendations/referrals: Colonoscopy: 04/03/2016; due every 10 years Recommended yearly ophthalmology/optometry visit for glaucoma screening and checkup Recommended yearly dental visit for hygiene and checkup  Vaccinations: Influenza vaccine: due Fall 2023 Pneumococcal vaccine: 10/02/2015, 05/19/2018 Tdap vaccine: 08/23/2010; due every 10 years (overdue) Shingles vaccine: due; recommend Shingrix which is 2 doses 2-6 months apart and over 90% effective     Covid-19: 11/19/2019, 12/14/2019, 07/25/2020, 03/29/2021  Advanced directives: Yes; Please bring a copy of your health care power of attorney and living will to the office at your convenience.  Conditions/risks identified: Yes  Next appointment: Please schedule your next Medicare Wellness Visit with your Nurse Health Advisor in 1 year by calling [phone].  Preventive Care 74 Years and Older, Male Preventive care refers to lifestyle choices and visits with your health care provider that can promote health and wellness. What does preventive care include? A yearly physical exam. This is also called an annual well check. Dental exams once or twice a year. Routine eye exams. Ask your health care provider how often you should have your eyes checked. Personal lifestyle choices, including: Daily care of your teeth and gums. Regular physical activity. Eating a healthy diet. Avoiding tobacco and drug use. Limiting alcohol use. Practicing safe sex. Taking low doses of aspirin every day. Taking vitamin and mineral supplements as recommended by your health care provider. What happens during an annual well check? The services and screenings done by your health care provider during your annual well  check will depend on your age, overall health, lifestyle risk factors, and family history of disease. Counseling  Your health care provider may ask you questions about your: Alcohol use. Tobacco use. Drug use. Emotional well-being. Home and relationship well-being. Sexual activity. Eating habits. History of falls. Memory and ability to understand (cognition). Work and work Statistician. Screening  You may have the following tests or measurements: Height, weight, and BMI. Blood pressure. Lipid and cholesterol levels. These may be checked every 5 years, or more frequently if you are over 74 years old. Skin check. Lung cancer screening. You may have this screening every year starting at age 74 if you have a 30-pack-year history of smoking and currently smoke or have quit within the past 15 years. Fecal occult blood test (FOBT) of the stool. You may have this test every year starting at age 74. Flexible sigmoidoscopy or colonoscopy. You may have a sigmoidoscopy every 5 years or a colonoscopy every 10 years starting at age 74. Prostate cancer screening. Recommendations will vary depending on your family history and other risks. Hepatitis C blood test. Hepatitis B blood test. Sexually transmitted disease (STD) testing. Diabetes screening. This is done by checking your blood sugar (glucose) after you have not eaten for a while (fasting). You may have this done every 1-3 years. Abdominal aortic aneurysm (AAA) screening. You may need this if you are a current or former smoker. Osteoporosis. You may be screened starting at age 45 if you are at high risk. Talk with your health care provider about your test results, treatment options, and if necessary, the need for more tests. Vaccines  Your health care provider may recommend certain vaccines, such as: Influenza vaccine. This is recommended every year. Tetanus, diphtheria, and acellular  pertussis (Tdap, Td) vaccine. You may need a Td booster  every 10 years. Zoster vaccine. You may need this after age 74. Pneumococcal 13-valent conjugate (PCV13) vaccine. One dose is recommended after age 74. Pneumococcal polysaccharide (PPSV23) vaccine. One dose is recommended after age 74. Talk to your health care provider about which screenings and vaccines you need and how often you need them. This information is not intended to replace advice given to you by your health care provider. Make sure you discuss any questions you have with your health care provider. Document Released: 10/26/2015 Document Revised: 06/18/2016 Document Reviewed: 07/31/2015 Elsevier Interactive Patient Education  2017 [place] Prevention in the Home Falls can cause injuries. They can happen to people of all ages. There are many things you can do to make your home safe and to help prevent falls. What can I do on the outside of my home? Regularly fix the edges of walkways and driveways and fix any cracks. Remove anything that might make you trip as you walk through a door, such as a raised step or threshold. Trim any bushes or trees on the path to your home. Use bright outdoor lighting. Clear any walking paths of anything that might make someone trip, such as rocks or tools. Regularly check to see if handrails are loose or broken. Make sure that both sides of any steps have handrails. Any raised decks and porches should have guardrails on the edges. Have any leaves, snow, or ice cleared regularly. Use sand or salt on walking paths during winter. Clean up any spills in your garage right away. This includes oil or grease spills. What can I do in the bathroom? Use night lights. Install grab bars by the toilet and in the tub and shower. Do not use towel bars as grab bars. Use non-skid mats or decals in the tub or shower. If you need to sit down in the shower, use a plastic, non-slip stool. Keep the floor dry. Clean up any water that spills on the floor as soon  as it happens. Remove soap buildup in the tub or shower regularly. Attach bath mats securely with double-sided non-slip rug tape. Do not have throw rugs and other things on the floor that can make you trip. What can I do in the bedroom? Use night lights. Make sure that you have a light by your bed that is easy to reach. Do not use any sheets or blankets that are too big for your bed. They should not hang down onto the floor. Have a firm chair that has side arms. You can use this for support while you get dressed. Do not have throw rugs and other things on the floor that can make you trip. What can I do in the kitchen? Clean up any spills right away. Avoid walking on wet floors. Keep items that you use a lot in easy-to-reach places. If you need to reach something above you, use a strong step stool that has a grab bar. Keep electrical cords out of the way. Do not use floor polish or wax that makes floors slippery. If you must use wax, use non-skid floor wax. Do not have throw rugs and other things on the floor that can make you trip. What can I do with my stairs? Do not leave any items on the stairs. Make sure that there are handrails on both sides of the stairs and use them. Fix handrails that are broken or loose. Make sure that handrails  are as long as the stairways. Check any carpeting to make sure that it is firmly attached to the stairs. Fix any carpet that is loose or worn. Avoid having throw rugs at the top or bottom of the stairs. If you do have throw rugs, attach them to the floor with carpet tape. Make sure that you have a light switch at the top of the stairs and the bottom of the stairs. If you do not have them, ask someone to add them for you. What else can I do to help prevent falls? Wear shoes that: Do not have high heels. Have rubber bottoms. Are comfortable and fit you well. Are closed at the toe. Do not wear sandals. If you use a stepladder: Make sure that it is fully  opened. Do not climb a closed stepladder. Make sure that both sides of the stepladder are locked into place. Ask someone to hold it for you, if possible. Clearly mark and make sure that you can see: Any grab bars or handrails. First and last steps. Where the edge of each step is. Use tools that help you move around (mobility aids) if they are needed. These include: Canes. Walkers. Scooters. Crutches. Turn on the lights when you go into a dark area. Replace any light bulbs as soon as they burn out. Set up your furniture so you have a clear path. Avoid moving your furniture around. If any of your floors are uneven, fix them. If there are any pets around you, be aware of where they are. Review your medicines with your doctor. Some medicines can make you feel dizzy. This can increase your chance of falling. Ask your doctor what other things that you can do to help prevent falls. This information is not intended to replace advice given to you by your health care provider. Make sure you discuss any questions you have with your health care provider. Document Released: 07/26/2009 Document Revised: 03/06/2016 Document Reviewed: 11/03/2014 Elsevier Interactive Patient Education  2017 [employer].

## 2022-06-09 NOTE — Progress Notes (Cosign Needed Addendum)
I connected with Maxwell Hinton today by telephone and verified that I am speaking with the correct person using two identifiers. Location patient: home Location provider: work Persons participating in the virtual visit: patient, provider.   I discussed the limitations, risks, security and privacy concerns of performing an evaluation and management service by telephone and the availability of in person appointments. I also discussed with the patient that there may be a patient responsible charge related to this service. The patient expressed understanding and verbally consented to this telephonic visit.    Interactive audio and video telecommunications were attempted between this provider and patient, however failed, due to patient having technical difficulties OR patient did not have access to video capability.  We continued and completed visit with audio only.  Some vital signs may be absent or patient reported.   Time Spent with patient on telephone encounter: 30 minutes  Subjective:   Maxwell Hinton is a 76 y.o. male who presents for an Initial Medicare Annual Wellness Visit.  Review of Systems     Cardiac Risk Factors include: advanced age (>75mn, >>38women);male gender;smoking/ tobacco exposure     Objective:    There were no vitals filed for this visit. There is no height or weight on file to calculate BMI.     06/09/2022   10:04 AM 05/07/2022    1:12 PM 03/12/2016    2:23 PM  Advanced Directives  Does Patient Have a Medical Advance Directive? Yes Yes Yes  Type of Advance Directive Living will;Healthcare Power of [place]  Does patient want to make changes to medical advance directive? No - Patient declined  No - Patient declined  Copy of HH[place]elandin Chart? No - copy requested  No - copy requested    Current Medications (verified) Outpatient Encounter Medications as  of 06/09/2022  Medication Sig   acetaminophen (TYLENOL) 650 MG CR tablet Take 650 mg by mouth 3 (three) times daily.   [EXPIRED] aspirin EC 81 MG tablet Take 1 tablet (81 mg total) by mouth 2 (two) times daily.   celecoxib (CELEBREX) 200 MG capsule Take 1 capsule (200 mg total) by mouth 2 (two) times daily.   Cholecalciferol (VITAMIN D3) 50 MCG (2000 UT) capsule Take 2,000 Units by mouth daily.   Cyanocobalamin (B-12 PO) Take 5,000 Units by mouth daily.   docusate sodium (COLACE) 100 MG capsule Take 1 capsule (100 mg total) by mouth daily as needed.   loratadine (CLARITIN) 10 MG tablet Take 10 mg by mouth daily.   losartan (COZAAR) 50 MG tablet Take 1 tablet (50 mg total) by mouth daily.   silver sulfADIAZINE (SILVADENE) 1 % cream Apply 1 application topically daily. (Patient not taking: Reported on 05/02/2022)   traMADol (ULTRAM) 50 MG tablet Take 1 tablet (50 mg total) by mouth every 6 (six) hours as needed. (Patient taking differently: Take 50 mg by mouth every 6 (six) hours as needed for severe pain or moderate pain.)   [DISCONTINUED] oxyCODONE (OXY IR/ROXICODONE) 5 MG immediate release tablet Take one tab po q4-6hrs prn pain   No facility-administered encounter medications on file as of 06/09/2022.    Allergies (verified) Patient has no known allergies.   History: Past Medical History:  Diagnosis Date   GERD (gastroesophageal reflux disease)    Skin cancer    Past Surgical History:  Procedure Laterality Date   colonoscopy with polypectomy     X2 ;  hyperplastic   HIP RESURFACING Right 2010   hip   TONSILLECTOMY AND ADENOIDECTOMY     as a child   TOTAL KNEE ARTHROPLASTY Left 05/09/2022   Procedure: TOTAL KNEE ARTHROPLASTY;  Surgeon: Earlie Server, MD;  Location: [facility];  Service: Orthopedics;  Laterality: Left;   Family History  Problem Relation Age of Onset   Diabetes Mother    Lung cancer Brother        2 ppd   Heart disease Neg Hx    Stroke Neg Hx    Colon cancer Neg  Hx    Social History   Socioeconomic History   Marital status: Married    Spouse name: Not on file   Number of children: 2   Years of education: Not on file   Highest education level: Not on file  Occupational History   Not on file  Tobacco Use   Smoking status: Some Days    Types: Cigars   Smokeless tobacco: Never  Vaping Use   Vaping Use: Never used  Substance and Sexual Activity   Alcohol use: Yes    Alcohol/week: 6.0 standard drinks of alcohol    Types: 2 Glasses of wine, 4 Standard drinks or equivalent per week   Drug use: No   Sexual activity: Not on file  Other Topics Concern   Not on file  Social History Narrative   Not on file   Social Determinants of Health   Financial Resource Strain: Low Risk  (06/09/2022)   Overall Financial Resource Strain (CARDIA)    Difficulty of Paying Living Expenses: Not hard at all  Food Insecurity: No Food Insecurity (06/09/2022)   Hunger Vital Sign    Worried About Running Out of Food in the Last Year: Never true    Ran Out of Food in the Last Year: Never true  Transportation Needs: No Transportation Needs (06/09/2022)   PRAPARE - Hydrologist (Medical): No    Lack of Transportation (Non-Medical): No  Physical Activity: Sufficiently Active (06/09/2022)   Exercise Vital Sign    Days of Exercise per Week: 5 days    Minutes of Exercise per Session: 30 min  Stress: No Stress Concern Present (06/09/2022)   [place]    Feeling of Stress : Not at all  Social Connections: [place] (06/09/2022)   Social Connection and Isolation Panel [NHANES]    Frequency of Communication with Friends and Family: More than three times a week    Frequency of Social Gatherings with Friends and Family: More than three times a week    Attends Religious Services: More than 4 times per year    Active Member of [employer] or Organizations: Yes    Attends Programme researcher, broadcasting/film/video: More than 4 times per year    Marital Status: Married    Tobacco Counseling Ready to quit: Not Answered Counseling given: Not Answered   Clinical Intake:  Pre-visit preparation completed: Yes  Pain : No/denies pain     Nutritional Risks: None Diabetes: No  How often do you need to have someone help you when you read instructions, pamphlets, or other written materials from your doctor or pharmacy?: 1 - Never What is the last grade level you completed in school?: Master's Degree  Diabetic?   Interpreter Needed?: No  Information entered by :: Lisette Abu, LPN.   Activities of Daily Living    06/09/2022   10:20 AM  05/07/2022    1:14 PM  In your present state of health, do you have any difficulty performing the following activities:  Hearing? 0   Vision? 0   Difficulty concentrating or making decisions? 0   Walking or climbing stairs? 0   Dressing or bathing? 0   Doing errands, shopping? 0 0  Preparing Food and eating ? N   Using the Toilet? N   In the past six months, have you accidently leaked urine? N   Do you have problems with loss of bowel control? N   Managing your Medications? N   Managing your Finances? N   Housekeeping or managing your Housekeeping? N     Patient Care Team: Biagio Borg, MD as PCP - General (Internal Medicine) [place], Heather, [place] as Consulting Physician (Optometry)  Indicate any recent Medical Services you may have received from other than [facility] providers in the past year (date may be approximate).     Assessment:   This is a routine wellness examination for [place].  Hearing/Vision screen Hearing Screening - Comments:: Patient denied any hearing difficulty.   No hearing aids.  Vision Screening - Comments:: Patient does wear readers.  Eye exam done by: Heather [place], OD.   Dietary issues and exercise activities discussed: Current Exercise Habits: Home exercise routine;Structured exercise class, Type of  exercise: walking;treadmill;stretching;strength training/weights;exercise ball;calisthenics, Time (Minutes): 30, Frequency (Times/Week): 5, Weekly Exercise (Minutes/Week): 150, Intensity: Moderate, Exercise limited by: None identified   Goals Addressed             This Visit's Progress    Stay breathing.        Depression Screen    06/09/2022   10:08 AM 03/13/2022    4:09 PM 03/13/2022    3:41 PM 07/25/2020    2:00 PM 07/25/2020    1:07 PM 05/25/2019    3:02 PM 05/19/2018    9:18 AM  PHQ 2/9 Scores  PHQ - 2 Score 0 0 0 0 0 0 0  PHQ- 9 Score   0        Fall Risk    06/09/2022   10:06 AM 03/13/2022    4:09 PM 03/13/2022    3:41 PM 07/25/2020    2:00 PM 07/25/2020    1:07 PM  [place] in the past year? 0 0 0 0 0  Number falls in past yr: 0 0 0  0  Injury with Fall? 0 0 0  0  Risk for fall due to : No Fall Risks    No Fall Risks  Follow up Falls evaluation completed    Falls evaluation completed    [place]:  Any stairs in or around the home? Yes  If so, are there any without handrails? No  Home free of loose throw rugs in walkways, pet beds, electrical cords, etc? Yes  Adequate lighting in your home to reduce risk of falls? Yes   ASSISTIVE DEVICES UTILIZED TO PREVENT FALLS:  Life alert? No  Use of a cane, walker or w/c? No  Grab bars in the bathroom? Yes  Shower chair or bench in shower? Yes  Elevated toilet seat or a handicapped toilet? Yes   TIMED UP AND GO:  Was the test performed? No .  Length of time to ambulate 10 feet: n/a sec.   Appearance of gait: Gait not evaluated during this visit.  Cognitive Function:        06/09/2022  10:21 AM  6CIT Screen  What Year? 0 points  What month? 0 points  What time? 0 points  Count back from 20 0 points  Months in reverse 0 points  Repeat phrase 0 points  Total Score 0 points    Immunizations Immunization History  Administered Date(s) Administered   Fluad Quad(high  Dose 65+) 07/18/2019, 07/25/2020   H1N1 11/02/2008   Influenza Split 09/30/2011, 10/21/2012   Influenza, High Dose Seasonal PF 08/27/2017, 08/27/2018   Influenza,inj,Quad PF,6+ Mos 11/07/2014, 08/22/2015   PFIZER Comirnaty(Gray Top)Covid-19 Tri-Sucrose Vaccine 03/29/2021   PFIZER(Purple Top)SARS-COV-2 Vaccination 11/19/2019, 12/14/2019, 07/25/2020   Pneumococcal Conjugate-13 10/02/2015   Pneumococcal Polysaccharide-23 05/19/2018   Td 08/23/2010   Zoster, Live 03/01/2013    TDAP status: Due, Education has been provided regarding the importance of this vaccine. Advised may receive this vaccine at local pharmacy or Health Dept. Aware to provide a copy of the vaccination record if obtained from local pharmacy or Health Dept. Verbalized acceptance and understanding.  Flu Vaccine status: Due, Education has been provided regarding the importance of this vaccine. Advised may receive this vaccine at local pharmacy or Health Dept. Aware to provide a copy of the vaccination record if obtained from local pharmacy or Health Dept. Verbalized acceptance and understanding.  Pneumococcal vaccine status: Up to date  Covid-19 vaccine status: Completed vaccines  Qualifies for Shingles Vaccine? Yes   Zostavax completed Yes   Shingrix Completed?: No.    Education has been provided regarding the importance of this vaccine. Patient has been advised to call insurance company to determine out of pocket expense if they have not yet received this vaccine. Advised may also receive vaccine at local pharmacy or Health Dept. Verbalized acceptance and understanding.  Screening Tests Health Maintenance  Topic Date Due   COVID-19 Vaccine (5 - Pfizer series) 05/24/2021   INFLUENZA VACCINE  05/13/2022   Zoster Vaccines- Shingrix (1 of 2) 06/13/2022 (Originally 04/20/1998)   TETANUS/TDAP  03/14/2023 (Originally 08/23/2020)   COLONOSCOPY (Pts 45-43yr Insurance coverage will need to be confirmed)  04/03/2026   Pneumonia  Vaccine 74 Years old  Completed   Hepatitis C Screening  Completed   HPV VACCINES  Aged Out    Health Maintenance  Health Maintenance Due  Topic Date Due   COVID-19 Vaccine (5 - Pfizer series) 05/24/2021   INFLUENZA VACCINE  05/13/2022    Colorectal cancer screening: Type of screening: Colonoscopy. Completed 04/03/2016. Repeat every 10 years  Lung Cancer Screening: (Low Dose CT Chest recommended if Age 74-80years, 30 pack-year currently smoking OR have quit w/in 15years.) does not qualify.   Lung Cancer Screening Referral: no  Additional Screening:  Hepatitis C Screening: does qualify; Completed 05/19/2018  Vision Screening: Recommended annual ophthalmology exams for early detection of glaucoma and other disorders of the eye. Is the patient up to date with their annual eye exam?  Yes  Who is the provider or what is the name of the office in which the patient attends annual eye exams? Heather [place] OD. If pt is not established with a provider, would they like to be referred to a provider to establish care? No .   Dental Screening: Recommended annual dental exams for proper oral hygiene  Community Resource Referral / Chronic Care Management: CRR required this visit?  No   CCM required this visit?  No      Plan:     I have personally reviewed and noted the following in the patient's chart:   Medical  and social history Use of alcohol, tobacco or illicit drugs  Current medications and supplements including opioid prescriptions. Patient is not currently taking opioid prescriptions. Functional ability and status Nutritional status Physical activity Advanced directives List of other physicians Hospitalizations, surgeries, and ER visits in previous 12 months Vitals Screenings to include cognitive, depression, and falls Referrals and appointments  In addition, I have reviewed and discussed with patient certain preventive protocols, quality metrics, and best practice  recommendations. A written personalized care plan for preventive services as well as general preventive health recommendations were provided to patient.     Sheral Flow, LPN   6/44/0347   Nurse Notes:  Patient is cogitatively intact. There were no vitals filed for this visit. There is no height or weight on file to calculate BMI. Patient stated that he has no issues with gait or balance; does not use any assistive devices.

## 2022-06-20 ENCOUNTER — Ambulatory Visit: Admit: 2022-06-20 | Payer: Medicare Other | Admitting: Orthopedic Surgery

## 2022-06-20 SURGERY — ARTHROPLASTY, KNEE, TOTAL
Anesthesia: Choice | Site: Knee | Laterality: Right

## 2022-10-08 ENCOUNTER — Ambulatory Visit (INDEPENDENT_AMBULATORY_CARE_PROVIDER_SITE_OTHER): Payer: Medicare Other

## 2022-10-08 ENCOUNTER — Ambulatory Visit: Payer: Medicare Other

## 2022-10-08 DIAGNOSIS — Z23 Encounter for immunization: Secondary | ICD-10-CM | POA: Diagnosis not present

## 2022-10-08 NOTE — Progress Notes (Addendum)
After obtaining consent, and per orders of Dr. Jenny Reichmann, injection of High flu shot was given by Marrian Salvage. Patient instructed to report any adverse reaction to me immediately.

## 2022-10-10 NOTE — Addendum Note (Signed)
Addended by: Ferol Luz, Grayce Budden P on: 10/10/2022 03:28 PM   Modules accepted: Orders

## 2023-01-10 DIAGNOSIS — L03114 Cellulitis of left upper limb: Secondary | ICD-10-CM | POA: Diagnosis not present

## 2023-01-10 DIAGNOSIS — S61432A Puncture wound without foreign body of left hand, initial encounter: Secondary | ICD-10-CM | POA: Diagnosis not present

## 2023-01-17 ENCOUNTER — Other Ambulatory Visit: Payer: Self-pay | Admitting: Internal Medicine

## 2023-01-29 DIAGNOSIS — Y92410 Unspecified street and highway as the place of occurrence of the external cause: Secondary | ICD-10-CM | POA: Diagnosis not present

## 2023-01-29 DIAGNOSIS — M19012 Primary osteoarthritis, left shoulder: Secondary | ICD-10-CM | POA: Diagnosis not present

## 2023-01-29 DIAGNOSIS — M79622 Pain in left upper arm: Secondary | ICD-10-CM | POA: Diagnosis not present

## 2023-01-29 DIAGNOSIS — M7918 Myalgia, other site: Secondary | ICD-10-CM | POA: Diagnosis not present

## 2023-01-29 DIAGNOSIS — Y9389 Activity, other specified: Secondary | ICD-10-CM | POA: Diagnosis not present

## 2023-02-23 ENCOUNTER — Other Ambulatory Visit: Payer: Self-pay | Admitting: Internal Medicine

## 2023-02-23 ENCOUNTER — Other Ambulatory Visit: Payer: Self-pay

## 2023-03-24 DIAGNOSIS — D485 Neoplasm of uncertain behavior of skin: Secondary | ICD-10-CM | POA: Diagnosis not present

## 2023-03-24 DIAGNOSIS — L570 Actinic keratosis: Secondary | ICD-10-CM | POA: Diagnosis not present

## 2023-03-24 DIAGNOSIS — C4442 Squamous cell carcinoma of skin of scalp and neck: Secondary | ICD-10-CM | POA: Diagnosis not present

## 2023-03-24 DIAGNOSIS — L820 Inflamed seborrheic keratosis: Secondary | ICD-10-CM | POA: Diagnosis not present

## 2023-03-24 DIAGNOSIS — M25561 Pain in right knee: Secondary | ICD-10-CM | POA: Diagnosis not present

## 2023-03-26 ENCOUNTER — Telehealth: Payer: Self-pay | Admitting: Internal Medicine

## 2023-03-26 DIAGNOSIS — M25561 Pain in right knee: Secondary | ICD-10-CM | POA: Diagnosis not present

## 2023-03-26 NOTE — Telephone Encounter (Signed)
Patient dropped off document Surgical Clearance, to be filled out by provider. Patient requested to send it via Fax within 7-days. Document is located in providers tray at front office.Please advise at Mobile [phone] (mobile)   Please fax to: [phone]

## 2023-03-31 NOTE — Telephone Encounter (Signed)
Form Faxed--

## 2023-04-01 ENCOUNTER — Other Ambulatory Visit: Payer: Self-pay

## 2023-04-01 ENCOUNTER — Ambulatory Visit (INDEPENDENT_AMBULATORY_CARE_PROVIDER_SITE_OTHER): Payer: Medicare Other | Admitting: Internal Medicine

## 2023-04-01 ENCOUNTER — Other Ambulatory Visit: Payer: Self-pay | Admitting: Internal Medicine

## 2023-04-01 VITALS — BP 128/76 | HR 75 | Temp 97.8°F | Ht >= 80 in | Wt 276.0 lb

## 2023-04-01 DIAGNOSIS — R739 Hyperglycemia, unspecified: Secondary | ICD-10-CM | POA: Diagnosis not present

## 2023-04-01 DIAGNOSIS — Z0001 Encounter for general adult medical examination with abnormal findings: Secondary | ICD-10-CM

## 2023-04-01 DIAGNOSIS — Z01818 Encounter for other preprocedural examination: Secondary | ICD-10-CM

## 2023-04-01 DIAGNOSIS — I10 Essential (primary) hypertension: Secondary | ICD-10-CM

## 2023-04-01 DIAGNOSIS — E7800 Pure hypercholesterolemia, unspecified: Secondary | ICD-10-CM

## 2023-04-01 DIAGNOSIS — Z0000 Encounter for general adult medical examination without abnormal findings: Secondary | ICD-10-CM

## 2023-04-01 NOTE — Progress Notes (Signed)
Patient ID: Maxwell Hinton, male   DOB: 1946-04-21, 77 y.o.   MRN: [id number]         Chief Complaint:: wellness exam and surgical clearance   , hyperglycemia, htn, hld       HPI:  Maxwell Hinton is a 77 y.o. male here for wellness exam; for shingrix and tdap at pharmacy, declines covid booster, o/w up to date                        Also Pt denies chest pain, increased sob or doe, wheezing, orthopnea, PND, increased LE swelling, palpitations, dizziness or syncope.   Pt denies polydipsia, polyuria, or new focal neuro s/s.    Pt denies fever, wt loss, night sweats, loss of appetite, or other constitutional symptoms   Due for orthopedic surgury soon Wt Readings from Last 3 Encounters:  04/01/23 276 lb (125.2 kg)  05/07/22 274 lb (124.3 kg)  03/13/22 280 lb (127 kg)   BP Readings from Last 3 Encounters:  04/01/23 128/76  05/09/22 126/77  05/07/22 (!) 155/98   Immunization History  Administered Date(s) Administered   Fluad Quad(high Dose 65+) 07/18/2019, 07/25/2020, 10/08/2022, 10/08/2022   H1N1 11/02/2008   Influenza Split 09/30/2011, 10/21/2012   Influenza, High Dose Seasonal PF 08/27/2017, 08/27/2018   Influenza,inj,Quad PF,6+ Mos 11/07/2014, 08/22/2015   PFIZER Comirnaty(Gray Top)Covid-19 Tri-Sucrose Vaccine 03/29/2021   PFIZER(Purple Top)SARS-COV-2 Vaccination 11/19/2019, 12/14/2019, 07/25/2020   Pneumococcal Conjugate-13 10/02/2015   Pneumococcal Polysaccharide-23 05/19/2018   Td 08/23/2010   Zoster, Live 03/01/2013   Health Maintenance Due  Topic Date Due   Zoster Vaccines- Shingrix (1 of 2) Never done   DTaP/Tdap/Td (2 - Tdap) 08/23/2020   COVID-19 Vaccine (5 - 2023-24 season) 06/13/2022      Past Medical History:  Diagnosis Date   GERD (gastroesophageal reflux disease)    Skin cancer    Past Surgical History:  Procedure Laterality Date   colonoscopy with polypectomy     X2 ; hyperplastic   HIP RESURFACING Right 2010   hip   TONSILLECTOMY AND  ADENOIDECTOMY     as a child   TOTAL KNEE ARTHROPLASTY Left 05/09/2022   Procedure: TOTAL KNEE ARTHROPLASTY;  Surgeon: Frederico Hamman, MD;  Location: [facility];  Service: Orthopedics;  Laterality: Left;    reports that he has been smoking cigars. He has never used smokeless tobacco. He reports current alcohol use of about 6.0 standard drinks of alcohol per week. He reports that he does not use drugs. family history includes Diabetes in his mother; Lung cancer in his brother. No Known Allergies Current Outpatient Medications on File Prior to Visit  Medication Sig Dispense Refill   acetaminophen (TYLENOL) 650 MG CR tablet Take 650 mg by mouth 3 (three) times daily.     celecoxib (CELEBREX) 200 MG capsule Take 1 capsule (200 mg total) by mouth 2 (two) times daily. 60 capsule 0   Cholecalciferol (VITAMIN D3) 50 MCG (2000 UT) capsule Take 2,000 Units by mouth daily.     Cyanocobalamin (B-12 PO) Take 5,000 Units by mouth daily.     docusate sodium (COLACE) 100 MG capsule Take 1 capsule (100 mg total) by mouth daily as needed. 30 capsule 2   loratadine (CLARITIN) 10 MG tablet Take 10 mg by mouth daily.     silver sulfADIAZINE (SILVADENE) 1 % cream Apply 1 application topically daily. 50 g 1   traMADol (ULTRAM) 50 MG tablet Take 1 tablet (50 mg  total) by mouth every 6 (six) hours as needed. (Patient taking differently: Take 50 mg by mouth every 6 (six) hours as needed for severe pain or moderate pain.) 30 tablet 0   No current facility-administered medications on file prior to visit.        ROS:  All others reviewed and negative.  Objective        PE:  BP 128/76 (BP Location: Left Arm, Patient Position: Sitting, Cuff Size: Normal)   Pulse 75   Temp 97.8 F (36.6 C) (Oral)   Ht 7' (2.134 m)   Wt 276 lb (125.2 kg)   SpO2 98%   BMI 27.50 kg/m                 Constitutional: Pt appears in NAD               HENT: Head: NCAT.                Right Ear: External ear normal.                 Left  Ear: External ear normal.                Eyes: . Pupils are equal, round, and reactive to light. Conjunctivae and EOM are normal               Nose: without d/c or deformity               Neck: Neck supple. Gross normal ROM               Cardiovascular: Normal rate and regular rhythm.                 Pulmonary/Chest: Effort normal and breath sounds without rales or wheezing.                Abd:  Soft, NT, ND, + BS, no organomegaly               Neurological: Pt is alert. At baseline orientation, motor grossly intact               Skin: Skin is warm. No rashes, no other new lesions, LE edema - none               Psychiatric: Pt behavior is normal without agitation   Micro: none  Cardiac tracings I have personally interpreted today:  none  Pertinent Radiological findings (summarize): none   Lab Results  Component Value Date   WBC 5.1 05/07/2022   HGB 15.4 05/07/2022   HCT 44.3 05/07/2022   PLT 250 05/07/2022   GLUCOSE 98 05/07/2022   CHOL 174 03/14/2022   TRIG 121.0 03/14/2022   HDL 48.80 03/14/2022   LDLCALC 101 (H) 03/14/2022   ALT 39 05/07/2022   AST 24 05/07/2022   NA 140 05/07/2022   K 4.2 05/07/2022   CL 108 05/07/2022   CREATININE 0.81 05/07/2022   BUN 21 05/07/2022   CO2 24 05/07/2022   TSH 1.33 03/14/2022   PSA 1.39 03/14/2022   HGBA1C 6.0 03/14/2022   Assessment/Plan:  Maxwell Hinton is a 77 y.o. White or Caucasian [1] male with  has a past medical history of GERD (gastroesophageal reflux disease) and Skin cancer.  Encounter for well adult exam with abnormal findings Age and sex appropriate education and counseling updated with regular exercise and diet Referrals for preventative services - none needed Immunizations addressed - for shingrix  atn tdap at pharmacy, declines covid booster Smoking counseling  - none needed Evidence for depression or other mood disorder - none significant Most recent labs reviewed. I have personally reviewed and have  noted: 1) the patient's medical and social history 2) The patient's current medications and supplements 3) The patient's height, weight, and BMI have been recorded in the chart   Hyperglycemia Lab Results  Component Value Date   HGBA1C 6.0 03/14/2022   Stable, pt to continue current medical treatment  - diet, wt control   HTN (hypertension) BP Readings from Last 3 Encounters:  04/01/23 128/76  05/09/22 126/77  05/07/22 (!) 155/98   Stable, pt to continue medical treatment losartan 50 mg qd   Preop exam for internal medicine Pt deemed ok for surgury from medical standpoint, form to be signed and faxed -   HLD (hyperlipidemia) Lab Results  Component Value Date   LDLCALC 101 (H) 03/14/2022   Uncontrolled, goal ldl < 100, pt for lower chol diet, declines statin for now  Followup: Return in about 6 months (around 10/01/2023).  Oliver Barre, MD 04/04/2023 5:07 PM [facility]

## 2023-04-04 ENCOUNTER — Encounter: Payer: Self-pay | Admitting: Internal Medicine

## 2023-04-04 NOTE — Assessment & Plan Note (Signed)
BP Readings from Last 3 Encounters:  04/01/23 128/76  05/09/22 126/77  05/07/22 (!) 155/98   Stable, pt to continue medical treatment losartan 50 mg qd

## 2023-04-04 NOTE — Assessment & Plan Note (Signed)
Pt deemed ok for surgury from medical standpoint, form to be signed and faxed -

## 2023-04-04 NOTE — Assessment & Plan Note (Signed)
Lab Results  Component Value Date   LDLCALC 101 (H) 03/14/2022   Uncontrolled, goal ldl < 100, pt for lower chol diet, declines statin for now

## 2023-04-04 NOTE — Patient Instructions (Signed)
Your form will be faxed  Please continue all other medications as before, and refills have been done if requested.  Please have the pharmacy call with any other refills you may need.  Please continue your efforts at being more active, low cholesterol diet, and weight control.  You are otherwise up to date with prevention measures today.  Please keep your appointments with your specialists as you may have planned  Please make an Appointment to return in 6 months, or sooner if needed

## 2023-04-04 NOTE — Assessment & Plan Note (Signed)
Age and sex appropriate education and counseling updated with regular exercise and diet Referrals for preventative services - none needed Immunizations addressed - for shingrix atn tdap at pharmacy, declines covid booster Smoking counseling  - none needed Evidence for depression or other mood disorder - none significant Most recent labs reviewed. I have personally reviewed and have noted: 1) the patient's medical and social history 2) The patient's current medications and supplements 3) The patient's height, weight, and BMI have been recorded in the chart

## 2023-04-04 NOTE — Assessment & Plan Note (Signed)
Lab Results  Component Value Date   HGBA1C 6.0 03/14/2022   Stable, pt to continue current medical treatment  - diet, wt control

## 2023-04-09 DIAGNOSIS — M1711 Unilateral primary osteoarthritis, right knee: Secondary | ICD-10-CM | POA: Diagnosis not present

## 2023-04-09 DIAGNOSIS — M25561 Pain in right knee: Secondary | ICD-10-CM | POA: Diagnosis not present

## 2023-04-09 DIAGNOSIS — Z01812 Encounter for preprocedural laboratory examination: Secondary | ICD-10-CM | POA: Diagnosis not present

## 2023-04-28 DIAGNOSIS — H524 Presbyopia: Secondary | ICD-10-CM | POA: Diagnosis not present

## 2023-04-28 DIAGNOSIS — D3132 Benign neoplasm of left choroid: Secondary | ICD-10-CM | POA: Diagnosis not present

## 2023-05-01 DIAGNOSIS — G8918 Other acute postprocedural pain: Secondary | ICD-10-CM | POA: Diagnosis not present

## 2023-05-01 DIAGNOSIS — M1711 Unilateral primary osteoarthritis, right knee: Secondary | ICD-10-CM | POA: Diagnosis not present

## 2023-05-14 DIAGNOSIS — M1711 Unilateral primary osteoarthritis, right knee: Secondary | ICD-10-CM | POA: Diagnosis not present

## 2023-05-28 DIAGNOSIS — C4442 Squamous cell carcinoma of skin of scalp and neck: Secondary | ICD-10-CM | POA: Diagnosis not present

## 2023-06-03 ENCOUNTER — Ambulatory Visit (INDEPENDENT_AMBULATORY_CARE_PROVIDER_SITE_OTHER): Payer: Medicare Other

## 2023-06-03 VITALS — Ht >= 80 in | Wt 269.0 lb

## 2023-06-03 DIAGNOSIS — Z0000 Encounter for general adult medical examination without abnormal findings: Secondary | ICD-10-CM

## 2023-06-03 NOTE — Patient Instructions (Signed)
Maxwell Hinton , Thank you for taking time to come for your Medicare Wellness Visit. I appreciate your ongoing commitment to your health goals. Please review the following plan we discussed and let me know if I can assist you in the future.   Referrals/Orders/Follow-Ups/Clinician Recommendations: No  This is a list of the screening recommended for you and due dates:  Health Maintenance  Topic Date Due   DTaP/Tdap/Td vaccine (2 - Tdap) 08/23/2020   COVID-19 Vaccine (6 - 2023-24 season) 01/20/2023   Flu Shot  05/14/2023   Medicare Annual Wellness Visit  06/02/2024   Colon Cancer Screening  04/03/2026   Pneumonia Vaccine  Completed   Hepatitis C Screening  Completed   Zoster (Shingles) Vaccine  Completed   HPV Vaccine  Aged Out    Advanced directives: (Copy Requested) Please bring a copy of your health care power of attorney and living will to the office to be added to your chart at your convenience.  Next Medicare Annual Wellness Visit scheduled for next year: Yes  Preventive Care attachment FALL PREVENTION attachment

## 2023-06-03 NOTE — Progress Notes (Signed)
Subjective:   Maxwell Hinton is a 77 y.o. male who presents for Medicare Annual/Subsequent preventive examination.  Visit Complete: Virtual  I connected with  Maxwell Hinton on 06/03/23 by a audio enabled telemedicine application and verified that I am speaking with the correct person using two identifiers.  Patient Location: Home  Provider Location: Home Office  I discussed the limitations of evaluation and management by telemedicine. The patient expressed understanding and agreed to proceed.  Patient Medicare AWV questionnaire was completed by the patient on 06/02/2023; I have confirmed that all information answered by patient is correct and no changes since this date.  Vital Signs: Because this visit was a virtual/telehealth visit, some criteria may be missing or patient reported. Any vitals not documented were not able to be obtained and vitals that have been documented are patient reported.   Review of Systems     Cardiac Risk Factors include: advanced age (>46men, >15 women);hypertension;male gender;smoking/ tobacco exposure;sedentary lifestyle     Objective:    Today's Vitals   06/03/23 1104 06/03/23 1105  Weight: 269 lb (122 kg)   Height: 7' (2.134 m)   PainSc: 0-No pain 0-No pain   Body mass index is 26.8 kg/m.     06/03/2023   11:06 AM 06/09/2022   10:04 AM 05/07/2022    1:12 PM 03/12/2016    2:23 PM  Advanced Directives  Does Patient Have a Medical Advance Directive? Yes Yes Yes Yes  Type of Estate agent of [place];Living will Living will;Healthcare Power of [employer] Power of [place];Living will Healthcare Power of [place];Living will  Does patient want to make changes to medical advance directive?  No - Patient declined  No - Patient declined  Copy of Healthcare Power of Attorney in Chart? No - copy requested No - copy requested  No - copy requested    Current Medications (verified) Outpatient Encounter Medications  as of 06/03/2023  Medication Sig   acetaminophen (TYLENOL) 650 MG CR tablet Take 650 mg by mouth 3 (three) times daily.   Cholecalciferol (VITAMIN D3) 50 MCG (2000 UT) capsule Take 2,000 Units by mouth daily.   Cyanocobalamin (B-12 PO) Take 5,000 Units by mouth daily.   loratadine (CLARITIN) 10 MG tablet Take 10 mg by mouth daily.   losartan (COZAAR) 50 MG tablet TAKE ONE TABLET BY MOUTH ONCE A DAY   [DISCONTINUED] silver sulfADIAZINE (SILVADENE) 1 % cream Apply 1 application topically daily.   [DISCONTINUED] traMADol (ULTRAM) 50 MG tablet Take 1 tablet (50 mg total) by mouth every 6 (six) hours as needed. (Patient taking differently: Take 50 mg by mouth every 6 (six) hours as needed for severe pain or moderate pain.)   No facility-administered encounter medications on file as of 06/03/2023.    Allergies (verified) Patient has no known allergies.   History: Past Medical History:  Diagnosis Date   GERD (gastroesophageal reflux disease)    Skin cancer    Past Surgical History:  Procedure Laterality Date   colonoscopy with polypectomy     X2 ; hyperplastic   HIP RESURFACING Right 2010   hip   TONSILLECTOMY AND ADENOIDECTOMY     as a child   TOTAL KNEE ARTHROPLASTY Left 05/09/2022   Procedure: TOTAL KNEE ARTHROPLASTY;  Surgeon: Frederico Hamman, MD;  Location: [facility];  Service: Orthopedics;  Laterality: Left;   Family History  Problem Relation Age of Onset   Diabetes Mother    Lung cancer Brother  2 ppd   Heart disease Neg Hx    Stroke Neg Hx    Colon cancer Neg Hx    Social History   Socioeconomic History   Marital status: Married    Spouse name: Not on file   Number of children: 2   Years of education: Not on file   Highest education level: Bachelor's degree (e.g., BA, AB, BS)  Occupational History   Not on file  Tobacco Use   Smoking status: Some Days    Types: Cigars   Smokeless tobacco: Never  Vaping Use   Vaping status: Never Used  Substance and Sexual  Activity   Alcohol use: Yes    Alcohol/week: 6.0 standard drinks of alcohol    Types: 2 Glasses of wine, 4 Standard drinks or equivalent per week   Drug use: No   Sexual activity: Not on file  Other Topics Concern   Not on file  Social History Narrative   Not on file   Social Determinants of Health   Financial Resource Strain: Low Risk  (06/03/2023)   Overall Financial Resource Strain (CARDIA)    Difficulty of Paying Living Expenses: Not hard at all  Food Insecurity: No Food Insecurity (06/03/2023)   Hunger Vital Sign    Worried About Running Out of Food in the Last Year: Never true    Ran Out of Food in the Last Year: Never true  Transportation Needs: No Transportation Needs (06/03/2023)   PRAPARE - Administrator, Civil Service (Medical): No    Lack of Transportation (Non-Medical): No  Physical Activity: Inactive (06/03/2023)   Exercise Vital Sign    Days of Exercise per Week: 0 days    Minutes of Exercise per Session: 0 min  Stress: No Stress Concern Present (06/03/2023)   [employer] of Occupational Health - Occupational Stress Questionnaire    Feeling of Stress : Only a little  Social Connections: Unknown (06/03/2023)   Social Connection and Isolation Panel [NHANES]    Frequency of Communication with Friends and Family: Once a week    Frequency of Social Gatherings with Friends and Family: Patient declined    Attends Religious Services: Patient declined    Database administrator or Organizations: No    Attends Engineer, structural: Patient declined    Marital Status: Married    Tobacco Counseling Ready to quit: Not Answered Counseling given: Not Answered   Clinical Intake:  Pre-visit preparation completed: Yes  Pain : No/denies pain Pain Score: 0-No pain     BMI - recorded: 26.8 Nutritional Status: BMI 25 -29 Overweight Nutritional Risks: None Diabetes: No  How often do you need to have someone help you when you read instructions,  pamphlets, or other written materials from your doctor or pharmacy?: 1 - Never What is the last grade level you completed in school?: Bachelor's Degree  Interpreter Needed?: No  Information entered by :: Elijah Phommachanh N. Amoria Mclees, LPN.   Activities of Daily Living    06/03/2023   11:15 AM 06/02/2023    1:37 PM  In your present state of health, do you have any difficulty performing the following activities:  Hearing? 0 0  Vision? 0 0  Difficulty concentrating or making decisions? 0 0  Walking or climbing stairs? 0 0  Dressing or bathing? 0 0  Doing errands, shopping? 0 0  Preparing Food and eating ? N N  Using the Toilet? N N  In the past six months, have you  accidently leaked urine? N N  Do you have problems with loss of bowel control? N N  Managing your Medications? N N  Managing your Finances? N N  Housekeeping or managing your Housekeeping? N N    Patient Care Team: Corwin Levins, MD as PCP - General (Internal Medicine) [place], Heather, OD as Consulting Physician (Optometry)  Indicate any recent Medical Services you may have received from other than [facility] providers in the past year (date may be approximate).     Assessment:   This is a routine wellness examination for [place].  Hearing/Vision screen Hearing Screening - Comments:: Patient denied any hearing difficulty.   No hearing aids.   Vision Screening - Comments:: Patient does wear otc readers.  Eye exam done by: Heather [place], OD.   Dietary issues and exercise activities discussed:     Goals Addressed             This Visit's Progress    Patient Stated       Lose some weight.      Depression Screen    06/03/2023   11:07 AM 04/01/2023    1:28 PM 06/09/2022   10:08 AM 03/13/2022    4:09 PM 03/13/2022    3:41 PM 07/25/2020    2:00 PM 07/25/2020    1:07 PM  PHQ 2/9 Scores  PHQ - 2 Score 0 0 0 0 0 0 0  PHQ- 9 Score 0    0      Fall Risk    06/03/2023   11:15 AM 06/02/2023    1:37 PM 04/01/2023    1:28 PM  06/09/2022   10:06 AM 03/13/2022    4:09 PM  Fall Risk   Falls in the past year? 0 0 0 0 0  Number falls in past yr: 0 0 0 0 0  Injury with Fall? 0 0 0 0 0  Risk for fall due to : No Fall Risks  No Fall Risks No Fall Risks   Follow up Falls prevention discussed  Falls evaluation completed Falls evaluation completed     MEDICARE RISK AT HOME: Medicare Risk at Home Any stairs in or around the home?: Yes If so, are there any without handrails?: No Home free of loose throw rugs in walkways, pet beds, electrical cords, etc?: Yes Adequate lighting in your home to reduce risk of falls?: Yes Life alert?: No Use of a cane, walker or w/c?: No Grab bars in the bathroom?: Yes Shower chair or bench in shower?: Yes Elevated toilet seat or a handicapped toilet?: Yes  TIMED UP AND GO:  Was the test performed?  No    Cognitive Function:        06/03/2023   11:16 AM 06/09/2022   10:21 AM  6CIT Screen  What Year? 0 points 0 points  What month? 0 points 0 points  What time? 0 points 0 points  Count back from 20 0 points 0 points  Months in reverse 0 points 0 points  Repeat phrase 0 points 0 points  Total Score 0 points 0 points    Immunizations Immunization History  Administered Date(s) Administered   Fluad Quad(high Dose 65+) 07/18/2019, 07/25/2020, 10/08/2022, 10/08/2022   H1N1 11/02/2008   Influenza Split 09/30/2011, 10/21/2012   Influenza, High Dose Seasonal PF 08/27/2017, 08/27/2018   Influenza,inj,Quad PF,6+ Mos 11/07/2014, 08/22/2015   PFIZER Comirnaty(Gray Top)Covid-19 Tri-Sucrose Vaccine 03/29/2021   PFIZER(Purple Top)SARS-COV-2 Vaccination 11/19/2019, 12/14/2019, 07/25/2020   Pfizer Covid-19 Theatre manager  51yrs & up 11/25/2022   Pneumococcal Conjugate-13 10/02/2015   Pneumococcal Polysaccharide-23 05/19/2018   RSV,unspecified 10/09/2022   Td 08/23/2010   Zoster Recombinant(Shingrix) 11/09/2020, 01/23/2021   Zoster, Live 03/01/2013    TDAP status: Due,  Education has been provided regarding the importance of this vaccine. Advised may receive this vaccine at local pharmacy or Health Dept. Aware to provide a copy of the vaccination record if obtained from local pharmacy or Health Dept. Verbalized acceptance and understanding.  Flu Vaccine status: Up to date  Pneumococcal vaccine status: Up to date  Covid-19 vaccine status: Completed vaccines  Qualifies for Shingles Vaccine? Yes   Zostavax completed Yes   Shingrix Completed?: Yes  Screening Tests Health Maintenance  Topic Date Due   DTaP/Tdap/Td (2 - Tdap) 08/23/2020   COVID-19 Vaccine (6 - 2023-24 season) 01/20/2023   INFLUENZA VACCINE  05/14/2023   Medicare Annual Wellness (AWV)  06/02/2024   Colonoscopy  04/03/2026   Pneumonia Vaccine 36+ Years old  Completed   Hepatitis C Screening  Completed   Zoster Vaccines- Shingrix  Completed   HPV VACCINES  Aged Out    Health Maintenance  Health Maintenance Due  Topic Date Due   DTaP/Tdap/Td (2 - Tdap) 08/23/2020   COVID-19 Vaccine (6 - 2023-24 season) 01/20/2023   INFLUENZA VACCINE  05/14/2023    Colorectal cancer screening: Type of screening: Colonoscopy. Completed 04/03/2016. Repeat every 10 years  Lung Cancer Screening: (Low Dose CT Chest recommended if Age 59-80 years, 20 pack-year currently smoking OR have quit w/in 15years.) does not qualify.   Lung Cancer Screening Referral: no  Additional Screening:  Hepatitis C Screening: does qualify; Completed 05/19/2018  Vision Screening: Recommended annual ophthalmology exams for early detection of glaucoma and other disorders of the eye. Is the patient up to date with their annual eye exam?  Yes  Who is the provider or what is the name of the office in which the patient attends annual eye exams? Heather [place], OD. If pt is not established with a provider, would they like to be referred to a provider to establish care? No .   Dental Screening: Recommended annual dental exams for  proper oral hygiene  Diabetic Foot Exam: N/A  Community Resource Referral / Chronic Care Management: CRR required this visit?  No   CCM required this visit?  No     Plan:     I have personally reviewed and noted the following in the patient's chart:   Medical and social history Use of alcohol, tobacco or illicit drugs  Current medications and supplements including opioid prescriptions. Patient is not currently taking opioid prescriptions. Functional ability and status Nutritional status Physical activity Advanced directives List of other physicians Hospitalizations, surgeries, and ER visits in previous 12 months Vitals Screenings to include cognitive, depression, and falls Referrals and appointments  In addition, I have reviewed and discussed with patient certain preventive protocols, quality metrics, and best practice recommendations. A written personalized care plan for preventive services as well as general preventive health recommendations were provided to patient.     Mickeal Needy, LPN   1/61/0960   After Visit Summary: (Mail) Due to this being a telephonic visit, the after visit summary with patients personalized plan was offered to patient via mail   Nurse Notes: Normal cognitive status assessed by direct observation via telephone conversation by this Nurse Health Advisor. No abnormalities found.

## 2023-06-09 DIAGNOSIS — M1711 Unilateral primary osteoarthritis, right knee: Secondary | ICD-10-CM | POA: Diagnosis not present

## 2023-12-08 DIAGNOSIS — L821 Other seborrheic keratosis: Secondary | ICD-10-CM | POA: Diagnosis not present

## 2023-12-08 DIAGNOSIS — D485 Neoplasm of uncertain behavior of skin: Secondary | ICD-10-CM | POA: Diagnosis not present

## 2024-03-27 ENCOUNTER — Other Ambulatory Visit: Payer: Self-pay | Admitting: Internal Medicine

## 2024-05-30 ENCOUNTER — Ambulatory Visit: Payer: Self-pay | Admitting: Internal Medicine

## 2024-05-30 ENCOUNTER — Ambulatory Visit (INDEPENDENT_AMBULATORY_CARE_PROVIDER_SITE_OTHER): Admitting: Internal Medicine

## 2024-05-30 ENCOUNTER — Encounter: Payer: Self-pay | Admitting: Internal Medicine

## 2024-05-30 ENCOUNTER — Other Ambulatory Visit: Payer: Self-pay | Admitting: Internal Medicine

## 2024-05-30 VITALS — BP 128/82 | HR 68 | Temp 97.9°F | Ht >= 80 in | Wt 276.0 lb

## 2024-05-30 DIAGNOSIS — E7800 Pure hypercholesterolemia, unspecified: Secondary | ICD-10-CM

## 2024-05-30 DIAGNOSIS — R739 Hyperglycemia, unspecified: Secondary | ICD-10-CM

## 2024-05-30 DIAGNOSIS — R413 Other amnesia: Secondary | ICD-10-CM | POA: Insufficient documentation

## 2024-05-30 DIAGNOSIS — E538 Deficiency of other specified B group vitamins: Secondary | ICD-10-CM

## 2024-05-30 DIAGNOSIS — J309 Allergic rhinitis, unspecified: Secondary | ICD-10-CM | POA: Insufficient documentation

## 2024-05-30 DIAGNOSIS — Z125 Encounter for screening for malignant neoplasm of prostate: Secondary | ICD-10-CM | POA: Diagnosis not present

## 2024-05-30 DIAGNOSIS — E559 Vitamin D deficiency, unspecified: Secondary | ICD-10-CM

## 2024-05-30 DIAGNOSIS — J342 Deviated nasal septum: Secondary | ICD-10-CM | POA: Insufficient documentation

## 2024-05-30 DIAGNOSIS — Z0001 Encounter for general adult medical examination with abnormal findings: Secondary | ICD-10-CM

## 2024-05-30 DIAGNOSIS — I10 Essential (primary) hypertension: Secondary | ICD-10-CM

## 2024-05-30 LAB — CBC WITH DIFFERENTIAL/PLATELET
Basophils Absolute: 0.1 K/uL (ref 0.0–0.1)
Basophils Relative: 1.1 % (ref 0.0–3.0)
Eosinophils Absolute: 0.1 K/uL (ref 0.0–0.7)
Eosinophils Relative: 1.6 % (ref 0.0–5.0)
HCT: 44.6 % (ref 39.0–52.0)
Hemoglobin: 15.4 g/dL (ref 13.0–17.0)
Lymphocytes Relative: 24.6 % (ref 12.0–46.0)
Lymphs Abs: 1.3 K/uL (ref 0.7–4.0)
MCHC: 34.5 g/dL (ref 30.0–36.0)
MCV: 87.2 fl (ref 78.0–100.0)
Monocytes Absolute: 0.4 K/uL (ref 0.1–1.0)
Monocytes Relative: 8 % (ref 3.0–12.0)
Neutro Abs: 3.4 K/uL (ref 1.4–7.7)
Neutrophils Relative %: 64.7 % (ref 43.0–77.0)
Platelets: 275 K/uL (ref 150.0–400.0)
RBC: 5.12 Mil/uL (ref 4.22–5.81)
RDW: 13.3 % (ref 11.5–15.5)
WBC: 5.2 K/uL (ref 4.0–10.5)

## 2024-05-30 LAB — LIPID PANEL
Cholesterol: 178 mg/dL (ref 0–200)
HDL: 50.3 mg/dL (ref >39.00)
LDL Cholesterol: 106 mg/dL — ABNORMAL HIGH (ref 0–99)
NonHDL: 127.74
Total CHOL/HDL Ratio: 4
Triglycerides: 109 mg/dL (ref 0.0–149.0)
VLDL: 21.8 mg/dL (ref 0.0–40.0)

## 2024-05-30 LAB — URINALYSIS, ROUTINE W REFLEX MICROSCOPIC
Bilirubin Urine: NEGATIVE
Hgb urine dipstick: NEGATIVE
Ketones, ur: NEGATIVE
Leukocytes,Ua: NEGATIVE
Nitrite: NEGATIVE
RBC / HPF: NONE SEEN (ref 0–?)
Specific Gravity, Urine: 1.015 (ref 1.000–1.030)
Total Protein, Urine: NEGATIVE
Urine Glucose: NEGATIVE
Urobilinogen, UA: 0.2 (ref 0.0–1.0)
WBC, UA: NONE SEEN (ref 0–?)
pH: 6 (ref 5.0–8.0)

## 2024-05-30 LAB — BASIC METABOLIC PANEL WITH GFR
BUN: 15 mg/dL (ref 6–23)
CO2: 26 meq/L (ref 19–32)
Calcium: 9.6 mg/dL (ref 8.4–10.5)
Chloride: 103 meq/L (ref 96–112)
Creatinine, Ser: 0.94 mg/dL (ref 0.40–1.50)
GFR: 78.96 mL/min (ref >60.00)
Glucose, Bld: 132 mg/dL — ABNORMAL HIGH (ref 70–99)
Potassium: 4.3 meq/L (ref 3.5–5.1)
Sodium: 138 meq/L (ref 135–145)

## 2024-05-30 LAB — HEPATIC FUNCTION PANEL
ALT: 59 U/L — ABNORMAL HIGH (ref 0–53)
AST: 33 U/L (ref 0–37)
Albumin: 4.5 g/dL (ref 3.5–5.2)
Alkaline Phosphatase: 45 U/L (ref 39–117)
Bilirubin, Direct: 0.2 mg/dL (ref 0.0–0.3)
Total Bilirubin: 0.9 mg/dL (ref 0.2–1.2)
Total Protein: 6.5 g/dL (ref 6.0–8.3)

## 2024-05-30 LAB — TSH: TSH: 1.23 u[IU]/mL (ref 0.35–5.50)

## 2024-05-30 LAB — VITAMIN B12: Vitamin B-12: >1500 pg/mL — ABNORMAL HIGH (ref 211–911)

## 2024-05-30 LAB — PSA: PSA: 1.35 ng/mL (ref 0.10–4.00)

## 2024-05-30 LAB — VITAMIN D 25 HYDROXY (VIT D DEFICIENCY, FRACTURES): VITD: 75.18 ng/mL (ref 30.00–100.00)

## 2024-05-30 LAB — HEMOGLOBIN A1C: Hgb A1c MFr Bld: 7.1 % — ABNORMAL HIGH (ref 4.6–6.5)

## 2024-05-30 MED ORDER — METFORMIN HCL ER 500 MG PO TB24: 500.0000 mg | ORAL_TABLET | Freq: Every day | ORAL | 3 refills | Status: AC

## 2024-05-30 MED ORDER — LOSARTAN POTASSIUM 50 MG PO TABS
50.0000 mg | ORAL_TABLET | Freq: Every day | ORAL | 3 refills | Status: DC
Start: 2024-05-30 — End: 2024-07-05

## 2024-05-30 NOTE — Assessment & Plan Note (Signed)
 Pt referred for mini mental status exam  with medicare wellness exam soon

## 2024-05-30 NOTE — Assessment & Plan Note (Addendum)
 Age and sex appropriate education and counseling updated with regular exercise and diet Referrals for preventative services - none needed Immunizations addressed - for tdap, rsv and flu shots at pharmacy Smoking counseling  - none needed Evidence for depression or other mood disorder - none significant Most recent labs reviewed. I have personally reviewed and have noted: 1) the patient's medical and social history 2) The patient's current medications and supplements 3) The patient's height, weight, and BMI have been recorded in the chart

## 2024-05-30 NOTE — Assessment & Plan Note (Signed)
Also for ENT referral 

## 2024-05-30 NOTE — Patient Instructions (Addendum)
 Please have the Tdap tetanus, flu shot and RSV shot at the pharmacy  Please see your Medicare Wellness visit for the memory check and other things  Please continue all other medications as before, and refills have been done if requested.  Please have the pharmacy call with any other refills you may need.  Please continue your efforts at being more active, low cholesterol diet, and weight control.  You are otherwise up to date with prevention measures today.  Please keep your appointments with your specialists as you may have planned  You will be contacted regarding the referral for: ENT for the nasal obstruction  Please go to the LAB at the blood drawing area for the tests to be done  You will be contacted by phone if any changes need to be made immediately.  Otherwise, you will receive a letter about your results with an explanation, but please check with MyChart first.  Please make an Appointment to return for your 1 year visit, or sooner if needed

## 2024-05-30 NOTE — Assessment & Plan Note (Signed)
 With recent worsening Mild to mod, for otc zyrtec and nasacort asd,,  to f/u any worsening symptoms or concerns

## 2024-05-30 NOTE — Assessment & Plan Note (Signed)
 BP Readings from Last 3 Encounters:  05/30/24 128/82  04/01/23 128/76  05/09/22 126/77   Stable, pt to continue medical treatment losartan  50 mg qd

## 2024-05-30 NOTE — Progress Notes (Signed)
 Patient ID: Maxwell Hinton, male   DOB: [birth year], 78 y.o.   MRN: [id number]         Chief Complaint:: wellness exam and Medical Management of Chronic Issues (Wants to see if he can have a memory baseline test, also wants allergy referral for chronic cough as well as ENT referral for possible deviated septum )  , and increased psa velocity       HPI:  Maxwell Hinton is a 78 y.o. male here for wellness exam; for tdap at pharmacy, o/w up to date                        Also Pt denies chest pain, increased sob or doe, wheezing, orthopnea, PND, increased LE swelling, palpitations, dizziness or syncope.   Pt denies polydipsia, polyuria, or new focal neuro s/s.    Pt denies fever, wt loss, night sweats, loss of appetite, or other constitutional symptoms  Does have several wks ongoing nasal allergy symptoms with clearish congestion, itch and sneezing, without fever, pain, ST, cough, swelling or wheezing.  Denies urinary symptoms such as dysuria, frequency, urgency, flank pain, hematuria or n/v, fever, chills.  Has deviated nasal septum with obstruction on the right per pt.  Also has had some forgetfulness recently.   Wt Readings from Last 3 Encounters:  05/30/24 276 lb (125.2 kg)  06/03/23 269 lb (122 kg)  04/01/23 276 lb (125.2 kg)   BP Readings from Last 3 Encounters:  05/30/24 128/82  04/01/23 128/76  05/09/22 126/77   Immunization History  Administered Date(s) Administered   Fluad Quad(high Dose 65+) 07/18/2019, 07/25/2020, 10/08/2022, 10/08/2022   H1N1 11/02/2008   Influenza Split 09/30/2011, 10/21/2012   Influenza, High Dose Seasonal PF 08/27/2017, 08/27/2018   Influenza,inj,Quad PF,6+ Mos 11/07/2014, 08/22/2015   PFIZER Comirnaty(Gray Top)Covid-19 Tri-Sucrose Vaccine 03/29/2021   PFIZER(Purple Top)SARS-COV-2 Vaccination 11/19/2019, 12/14/2019, 07/25/2020   Pfizer Covid-19 Vaccine Bivalent Booster 21yrs & up 11/25/2022   Pneumococcal Conjugate-13 10/02/2015   Pneumococcal  Polysaccharide-23 05/19/2018   RSV,unspecified 10/09/2022   Td 08/23/2010   Zoster Recombinant(Shingrix) 11/09/2020, 01/23/2021   Zoster, Live 03/01/2013   Health Maintenance Due  Topic Date Due   DTaP/Tdap/Td (2 - Tdap) 08/23/2020   INFLUENZA VACCINE  05/13/2024   Medicare Annual Wellness (AWV)  06/02/2024      Past Medical History:  Diagnosis Date   GERD (gastroesophageal reflux disease)    Skin cancer    Past Surgical History:  Procedure Laterality Date   colonoscopy with polypectomy     X2 ; hyperplastic   HIP RESURFACING Right 2010   hip   TONSILLECTOMY AND ADENOIDECTOMY     as a child   TOTAL KNEE ARTHROPLASTY Left 05/09/2022   Procedure: TOTAL KNEE ARTHROPLASTY;  Surgeon: Shari Sieving, MD;  Location: [facility];  Service: Orthopedics;  Laterality: Left;    reports that he has been smoking cigars. He has never used smokeless tobacco. He reports current alcohol use of about 6.0 standard drinks of alcohol per week. He reports that he does not use drugs. family history includes Diabetes in his mother; Lung cancer in his brother. No Known Allergies Current Outpatient Medications on File Prior to Visit  Medication Sig Dispense Refill   acetaminophen  (TYLENOL ) 650 MG CR tablet Take 650 mg by mouth 3 (three) times daily.     Cholecalciferol (VITAMIN D3) 50 MCG (2000 UT) capsule Take 2,000 Units by mouth daily.     Cyanocobalamin  (B-12 PO) Take  5,000 Units by mouth daily.     loratadine (CLARITIN) 10 MG tablet Take 10 mg by mouth daily.     No current facility-administered medications on file prior to visit.        ROS:  All others reviewed and negative.  Objective        PE:  BP 128/82   Pulse 68   Temp 97.9 F (36.6 C) (Temporal)   Ht 7' (2.134 m)   Wt 276 lb (125.2 kg)   SpO2 96%   BMI 27.50 kg/m                 Constitutional: Pt appears in NAD               HENT: Head: NCAT.                Right Ear: External ear normal.                 Left Ear: External  ear normal.                Eyes: . Pupils are equal, round, and reactive to light. Conjunctivae and EOM are normal               Nose: without d/c or deformity               Neck: Neck supple. Gross normal ROM               Cardiovascular: Normal rate and regular rhythm.                 Pulmonary/Chest: Effort normal and breath sounds without rales or wheezing.                Abd:  Soft, NT, ND, + BS, no organomegaly               Neurological: Pt is alert. At baseline orientation, motor grossly intact               Skin: Skin is warm. No rashes, no other new lesions, LE edema - none               Psychiatric: Pt behavior is normal without agitation   Micro: none  Cardiac tracings I have personally interpreted today:  none  Pertinent Radiological findings (summarize): none   Lab Results  Component Value Date   WBC 5.2 05/30/2024   HGB 15.4 05/30/2024   HCT 44.6 05/30/2024   PLT 275.0 05/30/2024   GLUCOSE 132 (H) 05/30/2024   CHOL 178 05/30/2024   TRIG 109.0 05/30/2024   HDL 50.30 05/30/2024   LDLCALC 106 (H) 05/30/2024   ALT 59 (H) 05/30/2024   AST 33 05/30/2024   NA 138 05/30/2024   K 4.3 05/30/2024   CL 103 05/30/2024   CREATININE 0.94 05/30/2024   BUN 15 05/30/2024   CO2 26 05/30/2024   TSH 1.23 05/30/2024   PSA 1.35 05/30/2024   HGBA1C 7.1 (H) 05/30/2024   Assessment/Plan:  Maxwell Hinton is a 78 y.o. White or Caucasian [1] male with  has a past medical history of GERD (gastroesophageal reflux disease) and Skin cancer.  Encounter for well adult exam with abnormal findings Age and sex appropriate education and counseling updated with regular exercise and diet Referrals for preventative services - none needed Immunizations addressed - for tdap, rsv and flu shots at pharmacy Smoking counseling  - none needed Evidence for depression or other  mood disorder - none significant Most recent labs reviewed. I have personally reviewed and have noted: 1) the patient's  medical and social history 2) The patient's current medications and supplements 3) The patient's height, weight, and BMI have been recorded in the chart   Hyperglycemia Lab Results  Component Value Date   HGBA1C 7.1 (H) 05/30/2024   uncontrolled pt to start metformin  ER 500 mg - 1 qd   HTN (hypertension) BP Readings from Last 3 Encounters:  05/30/24 128/82  04/01/23 128/76  05/09/22 126/77   Stable, pt to continue medical treatment losartan  50 mg qd   HLD (hyperlipidemia) Lab Results  Component Value Date   LDLCALC 106 (H) 05/30/2024   Uncontrolled, for lower chol diet, declines statin for now   Deviated nasal septum Also for ENT referral  Allergic rhinitis With recent worsening Mild to mod, for otc zyrtec and nasacort asd,,  to f/u any worsening symptoms or concerns   Memory change Pt referred for mini mental status exam  with medicare wellness exam soon  Followup: Return in about 1 year (around 05/30/2025).  Lynwood Rush, MD 05/30/2024 8:11 PM [facility]

## 2024-05-30 NOTE — Assessment & Plan Note (Signed)
 Lab Results  Component Value Date   HGBA1C 7.1 (H) 05/30/2024   uncontrolled pt to start metformin  ER 500 mg - 1 qd

## 2024-05-30 NOTE — Assessment & Plan Note (Signed)
 Lab Results  Component Value Date   LDLCALC 106 (H) 05/30/2024   Uncontrolled, for lower chol diet, declines statin for now

## 2024-05-31 ENCOUNTER — Ambulatory Visit (INDEPENDENT_AMBULATORY_CARE_PROVIDER_SITE_OTHER): Admitting: Otolaryngology

## 2024-05-31 ENCOUNTER — Encounter (INDEPENDENT_AMBULATORY_CARE_PROVIDER_SITE_OTHER): Payer: Self-pay | Admitting: Otolaryngology

## 2024-05-31 VITALS — BP 149/89 | HR 74

## 2024-05-31 DIAGNOSIS — J342 Deviated nasal septum: Secondary | ICD-10-CM | POA: Diagnosis not present

## 2024-05-31 DIAGNOSIS — R0981 Nasal congestion: Secondary | ICD-10-CM

## 2024-05-31 DIAGNOSIS — J310 Chronic rhinitis: Secondary | ICD-10-CM

## 2024-05-31 DIAGNOSIS — J3489 Other specified disorders of nose and nasal sinuses: Secondary | ICD-10-CM

## 2024-05-31 DIAGNOSIS — J343 Hypertrophy of nasal turbinates: Secondary | ICD-10-CM

## 2024-06-01 DIAGNOSIS — J310 Chronic rhinitis: Secondary | ICD-10-CM | POA: Insufficient documentation

## 2024-06-01 DIAGNOSIS — J343 Hypertrophy of nasal turbinates: Secondary | ICD-10-CM | POA: Insufficient documentation

## 2024-06-01 NOTE — Progress Notes (Signed)
 CC: Chronic nasal obstruction  HPI:  Maxwell Hinton is a 78 y.o. male who presents today complaining of chronic nasal obstruction for 10+ years.  The severity of his nasal obstruction has increased over the past few years.  He has a history of nasal fractures twice.  The fractures were not treated.  It resulted in nasal dorsal deviation as well as nasal septal deviation.  He also has a history of environmental allergies.  He uses Claritin daily.  He has no previous nasal surgery.  Currently he denies any facial pain, fever, or visual change.  Past Medical History:  Diagnosis Date   GERD (gastroesophageal reflux disease)    Skin cancer     Past Surgical History:  Procedure Laterality Date   colonoscopy with polypectomy     X2 ; hyperplastic   HIP RESURFACING Right 2010   hip   TONSILLECTOMY AND ADENOIDECTOMY     as a child   TOTAL KNEE ARTHROPLASTY Left 05/09/2022   Procedure: TOTAL KNEE ARTHROPLASTY;  Surgeon: Shari Sieving, MD;  Location: [facility];  Service: Orthopedics;  Laterality: Left;    Family History  Problem Relation Age of Onset   Diabetes Mother    Lung cancer Brother        2 ppd   Heart disease Neg Hx    Stroke Neg Hx    Colon cancer Neg Hx     Social History:  reports that he has been smoking cigars. He has never used smokeless tobacco. He reports current alcohol use of about 6.0 standard drinks of alcohol per week. He reports that he does not use drugs.  Allergies: No Known Allergies  Prior to Admission medications   Medication Sig Start Date End Date Taking? Authorizing Provider  acetaminophen  (TYLENOL ) 650 MG CR tablet Take 650 mg by mouth 3 (three) times daily.   Yes [provider]  Cholecalciferol (VITAMIN D3) 50 MCG (2000 UT) capsule Take 2,000 Units by mouth daily.   Yes [provider]  Cyanocobalamin  (B-12 PO) Take 5,000 Units by mouth daily.   Yes [provider]  loratadine (CLARITIN) 10 MG tablet Take 10 mg by mouth  daily.   Yes [provider]  losartan  (COZAAR ) 50 MG tablet Take 1 tablet (50 mg total) by mouth daily. 05/30/24  Yes Norleen Lynwood ORN, MD  metFORMIN  (GLUCOPHAGE -XR) 500 MG 24 hr tablet Take 1 tablet (500 mg total) by mouth daily with breakfast. 05/30/24  Yes Norleen Lynwood ORN, MD    Blood pressure (!) 149/89, pulse 74, SpO2 94%. Exam: General: Communicates without difficulty, well nourished, no acute distress. Head: Normocephalic, no evidence injury, no tenderness, facial buttresses intact without stepoff. Face/sinus: No tenderness to palpation and percussion. Facial movement is normal and symmetric. Eyes: PERRL, EOMI. No scleral icterus, conjunctivae clear. Neuro: CN II exam reveals vision grossly intact.  No nystagmus at any point of gaze. Ears: Auricles well formed without lesions.  Ear canals are intact without mass or lesion.  No erythema or edema is appreciated.  The TMs are intact without fluid. Nose: External evaluation reveals normal support and skin without lesions.  Dorsum is intact.  Anterior rhinoscopy reveals congested mucosa over anterior aspect of inferior turbinates and severely deviated septum.  No purulence noted. Oral:  Oral cavity and oropharynx are intact, symmetric, without erythema or edema.  Mucosa is moist without lesions. Neck: Full range of motion without pain.  There is no significant lymphadenopathy.  No masses palpable.  Thyroid  bed  within normal limits to palpation.  Parotid glands and submandibular glands equal bilaterally without mass.  Trachea is midline. Neuro:  CN 2-12 grossly intact.   Procedure:  Flexible Nasal Endoscopy: Description: Risks, benefits, and alternatives of flexible endoscopy were explained to the patient.  Specific mention was made of the risk of throat numbness with difficulty swallowing, possible bleeding from the nose and mouth, and pain from the procedure.  The patient gave oral consent to proceed.  The flexible scope was inserted into the right  nasal cavity.  Endoscopy of the interior nasal cavity, superior, inferior, and middle meatus was performed. The sphenoid-ethmoid recess was examined. Edematous mucosa was noted.  No polyp, mass, or lesion was appreciated.  Severe nasal septal deviation noted. Olfactory cleft was clear.  Nasopharynx was clear.  Turbinates were hypertrophied but without mass.  The procedure was repeated on the contralateral side with similar findings.  The patient tolerated the procedure well.   Assessment: 1.  Chronic nasal obstruction, secondary to nasal mucosal congestion, severe nasal septal deviation, and bilateral inferior turbinate hypertrophy.  More than 95% of his nasal passageways are obstructed bilaterally. 2.  No suspicious mass or lesion is noted today.  Plan: 1.  The physical exam and nasal endoscopy findings are reviewed with the patient. 2.  Flonase nasal spray and Claritin daily. 3.  In light of his chronic and severe nasal obstruction, he will likely benefit from surgical intervention with septoplasty and bilateral turbinate reduction.  The risk, benefits, alternatives, and details of the procedures are extensively discussed.  Questions are invited and answered. 4.  The patient would like to proceed with the procedures.  Will schedule the procedures in accordance with the patient's schedule.  Florette Thai W Kyoko Elsea 06/01/2024, 9:33 AM

## 2024-06-06 ENCOUNTER — Ambulatory Visit: Payer: Medicare Other

## 2024-06-06 DIAGNOSIS — I499 Cardiac arrhythmia, unspecified: Secondary | ICD-10-CM | POA: Diagnosis not present

## 2024-06-09 NOTE — Telephone Encounter (Signed)
 Needs OV please.

## 2024-06-11 DIAGNOSIS — E1165 Type 2 diabetes mellitus with hyperglycemia: Secondary | ICD-10-CM | POA: Diagnosis not present

## 2024-06-11 DIAGNOSIS — R42 Dizziness and giddiness: Secondary | ICD-10-CM | POA: Diagnosis not present

## 2024-06-11 DIAGNOSIS — R079 Chest pain, unspecified: Secondary | ICD-10-CM | POA: Diagnosis not present

## 2024-06-11 DIAGNOSIS — I493 Ventricular premature depolarization: Secondary | ICD-10-CM | POA: Diagnosis not present

## 2024-06-11 DIAGNOSIS — I10 Essential (primary) hypertension: Secondary | ICD-10-CM | POA: Diagnosis not present

## 2024-06-11 DIAGNOSIS — I491 Atrial premature depolarization: Secondary | ICD-10-CM | POA: Diagnosis not present

## 2024-06-11 DIAGNOSIS — R030 Elevated blood-pressure reading, without diagnosis of hypertension: Secondary | ICD-10-CM | POA: Diagnosis not present

## 2024-06-20 ENCOUNTER — Ambulatory Visit: Admitting: Internal Medicine

## 2024-06-20 VITALS — BP 140/88 | HR 77 | Temp 97.9°F | Ht >= 80 in | Wt 274.2 lb

## 2024-06-20 DIAGNOSIS — I10 Essential (primary) hypertension: Secondary | ICD-10-CM

## 2024-06-20 DIAGNOSIS — I493 Ventricular premature depolarization: Secondary | ICD-10-CM | POA: Diagnosis not present

## 2024-06-20 DIAGNOSIS — R739 Hyperglycemia, unspecified: Secondary | ICD-10-CM | POA: Diagnosis not present

## 2024-06-20 DIAGNOSIS — E7800 Pure hypercholesterolemia, unspecified: Secondary | ICD-10-CM | POA: Diagnosis not present

## 2024-06-20 MED ORDER — METOPROLOL SUCCINATE ER 50 MG PO TB24: 50.0000 mg | ORAL_TABLET | Freq: Every day | ORAL | 3 refills | Status: AC

## 2024-06-20 NOTE — Patient Instructions (Addendum)
 Ok to start the metoprolol  XL 50 mg per day  Please continue all other medications as before, and refills have been done if requested.  Please have the pharmacy call with any other refills you may need.  Please continue your efforts at being more active, low cholesterol diet, and weight control.  Please keep your appointments with your specialists as you may have planned  Please check your BP at home and let us  know on the Mychart messaging how the BP and the Heart Rates are running in 1 week  If the BP is still high, we could increase the losartan , or consider change to another similar called Benicar  Please make an Appointment to return in 3 months, or sooner if needed  Good Luck with your Surgury sept 29!

## 2024-06-20 NOTE — Progress Notes (Unsigned)
 Patient ID: Maxwell Hinton, male   DOB: 1946/06/13, 78 y.o.   MRN: [id number]        Chief Complaint: follow up HTN, hyperglycemia, hld, PVc's       HPI:  Maxwell Hinton is a 78 y.o. male here after episode elevated BP even up to 200 2 days ago, seen at [facility] in [place] , ECG c/w SR with PVC by report.  Started on metoprolol  xl 50 mg but not yet started until came here.  Pt denies chest pain, increased sob or doe, wheezing, orthopnea, PND, increased LE swelling, palpitations, dizziness or syncope.   Pt denies polydipsia, polyuria, or new focal neuro s/s.          Wt Readings from Last 3 Encounters:  06/20/24 274 lb 4 oz (124.4 kg)  05/30/24 276 lb (125.2 kg)  06/03/23 269 lb (122 kg)   BP Readings from Last 3 Encounters:  06/20/24 (!) 140/88  05/31/24 (!) 149/89  05/30/24 128/82         Past Medical History:  Diagnosis Date   GERD (gastroesophageal reflux disease)    Skin cancer    Past Surgical History:  Procedure Laterality Date   colonoscopy with polypectomy     X2 ; hyperplastic   HIP RESURFACING Right 2010   hip   TONSILLECTOMY AND ADENOIDECTOMY     as a child   TOTAL KNEE ARTHROPLASTY Left 05/09/2022   Procedure: TOTAL KNEE ARTHROPLASTY;  Surgeon: Shari Sieving, MD;  Location: [facility];  Service: Orthopedics;  Laterality: Left;    reports that he has been smoking cigars. He has never used smokeless tobacco. He reports current alcohol use of about 6.0 standard drinks of alcohol per week. He reports that he does not use drugs. family history includes Diabetes in his mother; Lung cancer in his brother. No Known Allergies Current Outpatient Medications on File Prior to Visit  Medication Sig Dispense Refill   acetaminophen  (TYLENOL ) 650 MG CR tablet Take 650 mg by mouth 3 (three) times daily.     Cholecalciferol (VITAMIN D3) 50 MCG (2000 UT) capsule Take 2,000 Units by mouth daily.     Cyanocobalamin  (B-12 PO) Take 5,000 Units by mouth daily.     loratadine (CLARITIN)  10 MG tablet Take 10 mg by mouth daily.     losartan  (COZAAR ) 50 MG tablet Take 1 tablet (50 mg total) by mouth daily. 90 tablet 3   metFORMIN  (GLUCOPHAGE -XR) 500 MG 24 hr tablet Take 1 tablet (500 mg total) by mouth daily with breakfast. 90 tablet 3   No current facility-administered medications on file prior to visit.        ROS:  All others reviewed and negative.  Objective        PE:  BP (!) 140/88   Pulse 77   Temp 97.9 F (36.6 C) (Temporal)   Ht 7' (2.134 m)   Wt 274 lb 4 oz (124.4 kg)   SpO2 97%   BMI 27.33 kg/m                 Constitutional: Pt appears in NAD               HENT: Head: NCAT.                Right Ear: External ear normal.                 Left Ear: External ear normal.  Eyes: . Pupils are equal, round, and reactive to light. Conjunctivae and EOM are normal               Nose: without d/c or deformity               Neck: Neck supple. Gross normal ROM               Cardiovascular: Normal rate and regular rhythm.                 Pulmonary/Chest: Effort normal and breath sounds without rales or wheezing.                Abd:  Soft, NT, ND, + BS, no organomegaly               Neurological: Pt is alert. At baseline orientation, motor grossly intact               Skin: Skin is warm. No rashes, no other new lesions, LE edema - none               Psychiatric: Pt behavior is normal without agitation   Micro: none  Cardiac tracings I have personally interpreted today:  none  Pertinent Radiological findings (summarize): none   Lab Results  Component Value Date   WBC 5.2 05/30/2024   HGB 15.4 05/30/2024   HCT 44.6 05/30/2024   PLT 275.0 05/30/2024   GLUCOSE 132 (H) 05/30/2024   CHOL 178 05/30/2024   TRIG 109.0 05/30/2024   HDL 50.30 05/30/2024   LDLCALC 106 (H) 05/30/2024   ALT 59 (H) 05/30/2024   AST 33 05/30/2024   NA 138 05/30/2024   K 4.3 05/30/2024   CL 103 05/30/2024   CREATININE 0.94 05/30/2024   BUN 15 05/30/2024   CO2 26  05/30/2024   TSH 1.23 05/30/2024   PSA 1.35 05/30/2024   HGBA1C 7.1 (H) 05/30/2024   Assessment/Plan:  Maxwell Hinton is a 78 y.o. White or Caucasian [1] male with  has a past medical history of GERD (gastroesophageal reflux disease) and Skin cancer.  HLD (hyperlipidemia) Lab Results  Component Value Date   LDLCALC 106 (H) 05/30/2024   Uncontrolled, pt for lower chol diet, declines statin or other change in tx   HTN (hypertension) BP Readings from Last 3 Encounters:  06/20/24 (!) 140/88  05/31/24 (!) 149/89  05/30/24 128/82   uncontrolled, pt reassured ok to start the metoprolol  xl 50 mg every day, and continue losartan  50 mg every day, f/u BP at home and next visit   Hyperglycemia Lab Results  Component Value Date   HGBA1C 7.1 (H) 05/30/2024   uncontrolled, pt to continue current medical treatment metformin  ER 500 gm 1 every day, decliens change today   PVC's (premature ventricular contractions) Also should improve with BB as above,  to f/u any worsening symptoms or concerns  Followup: Return in about 3 months (around 09/19/2024).  Lynwood Rush, MD 06/24/2024 8:04 PM [facility]

## 2024-06-24 ENCOUNTER — Encounter: Payer: Self-pay | Admitting: Internal Medicine

## 2024-06-24 DIAGNOSIS — I493 Ventricular premature depolarization: Secondary | ICD-10-CM | POA: Insufficient documentation

## 2024-06-24 NOTE — Assessment & Plan Note (Signed)
 BP Readings from Last 3 Encounters:  06/20/24 (!) 140/88  05/31/24 (!) 149/89  05/30/24 128/82   uncontrolled, pt reassured ok to start the metoprolol  xl 50 mg every day, and continue losartan  50 mg every day, f/u BP at home and next visit

## 2024-06-24 NOTE — Assessment & Plan Note (Signed)
 Lab Results  Component Value Date   HGBA1C 7.1 (H) 05/30/2024   uncontrolled, pt to continue current medical treatment metformin  ER 500 gm 1 every day, decliens change today

## 2024-06-24 NOTE — Assessment & Plan Note (Signed)
Also should improve with BB as above,  to f/u any worsening symptoms or concerns

## 2024-06-24 NOTE — Assessment & Plan Note (Signed)
 Lab Results  Component Value Date   LDLCALC 106 (H) 05/30/2024   Uncontrolled, pt for lower chol diet, declines statin or other change in tx

## 2024-06-30 ENCOUNTER — Encounter: Payer: Self-pay | Admitting: Internal Medicine

## 2024-07-04 ENCOUNTER — Other Ambulatory Visit: Payer: Self-pay

## 2024-07-04 ENCOUNTER — Encounter (HOSPITAL_BASED_OUTPATIENT_CLINIC_OR_DEPARTMENT_OTHER)
Admission: RE | Admit: 2024-07-04 | Discharge: 2024-07-04 | Disposition: A | Discharge status: Home / Self Care | Source: Ambulatory Visit | Attending: Otolaryngology | Admitting: Otolaryngology

## 2024-07-04 ENCOUNTER — Encounter (HOSPITAL_BASED_OUTPATIENT_CLINIC_OR_DEPARTMENT_OTHER): Payer: Self-pay | Admitting: Otolaryngology

## 2024-07-04 DIAGNOSIS — Z01818 Encounter for other preprocedural examination: Secondary | ICD-10-CM | POA: Insufficient documentation

## 2024-07-04 LAB — BASIC METABOLIC PANEL WITH GFR
Anion gap: 11 (ref 5–15)
BUN: 15 mg/dL (ref 8–23)
CO2: 23 mmol/L (ref 22–32)
Calcium: 8.9 mg/dL (ref 8.9–10.3)
Chloride: 101 mmol/L (ref 98–111)
Creatinine, Ser: 1.01 mg/dL (ref 0.61–1.24)
GFR, Estimated: >60 mL/min (ref >60)
Glucose, Bld: 216 mg/dL — ABNORMAL HIGH (ref 70–99)
Potassium: 4.1 mmol/L (ref 3.5–5.1)
Sodium: 135 mmol/L (ref 135–145)

## 2024-07-05 MED ORDER — LOSARTAN POTASSIUM 100 MG PO TABS: 100.0000 mg | ORAL_TABLET | Freq: Every day | ORAL | 3 refills | Status: DC

## 2024-07-08 NOTE — Anesthesia Preprocedure Evaluation (Addendum)
 Anesthesia Evaluation  Patient identified by MRN, date of birth, ID band Patient awake    Reviewed: Allergy & Precautions, NPO status , Patient's Chart, lab work & pertinent test results, reviewed documented beta blocker date and time   Airway Mallampati: III  TM Distance: >3 FB Neck ROM: Full    Dental  (+) Teeth Intact, Dental Advisory Given   Pulmonary former smoker   Pulmonary exam normal breath sounds clear to auscultation       Cardiovascular hypertension (153/85 proep, per pt normally 150s), Pt. on medications and Pt. on home beta blockers Normal cardiovascular exam Rhythm:Regular Rate:Normal     Neuro/Psych negative neurological ROS  negative psych ROS   GI/Hepatic Neg liver ROS,GERD  Controlled,,  Endo/Other  diabetes, Well Controlled, Type 2, Oral Hypoglycemic Agents  BMI 31 A1c 7.1  Renal/GU negative Renal ROS  negative genitourinary   Musculoskeletal negative musculoskeletal ROS (+)    Abdominal   Peds  Hematology negative hematology ROS (+)   Anesthesia Other Findings   Reproductive/Obstetrics negative OB ROS                              Anesthesia Physical Anesthesia Plan  ASA: 2  Anesthesia Plan: General   Post-op Pain Management: Tylenol  PO (pre-op)*   Induction: Intravenous  PONV Risk Score and Plan: 2 and Ondansetron , Dexamethasone  and Treatment may vary due to age or medical condition  Airway Management Planned: Oral ETT and Video Laryngoscope Planned  Additional Equipment: None  Intra-op Plan:   Post-operative Plan: Extubation in OR  Informed Consent: I have reviewed the patients History and Physical, chart, labs and discussed the procedure including the risks, benefits and alternatives for the proposed anesthesia with the patient or authorized representative who has indicated his/her understanding and acceptance.     Dental advisory given  Plan  Discussed with: CRNA  Anesthesia Plan Comments: (No prior intubation records- mall3, long prominent front top teeth- will have glide available)         Anesthesia Quick Evaluation

## 2024-07-11 ENCOUNTER — Other Ambulatory Visit: Payer: Self-pay

## 2024-07-11 ENCOUNTER — Ambulatory Visit (HOSPITAL_BASED_OUTPATIENT_CLINIC_OR_DEPARTMENT_OTHER)
Admission: RE | Admit: 2024-07-11 | Discharge: 2024-07-11 | Disposition: A | Discharge status: Home / Self Care | Attending: Otolaryngology | Admitting: Otolaryngology

## 2024-07-11 ENCOUNTER — Encounter (HOSPITAL_BASED_OUTPATIENT_CLINIC_OR_DEPARTMENT_OTHER): Payer: Self-pay | Admitting: Otolaryngology

## 2024-07-11 ENCOUNTER — Ambulatory Visit (HOSPITAL_BASED_OUTPATIENT_CLINIC_OR_DEPARTMENT_OTHER): Payer: Self-pay | Admitting: Anesthesiology

## 2024-07-11 ENCOUNTER — Encounter (HOSPITAL_BASED_OUTPATIENT_CLINIC_OR_DEPARTMENT_OTHER)
Admission: RE | Disposition: A | Discharge status: Home / Self Care | Payer: Self-pay | Source: Home / Self Care | Attending: Otolaryngology

## 2024-07-11 DIAGNOSIS — J342 Deviated nasal septum: Secondary | ICD-10-CM | POA: Diagnosis not present

## 2024-07-11 DIAGNOSIS — Z7984 Long term (current) use of oral hypoglycemic drugs: Secondary | ICD-10-CM | POA: Diagnosis not present

## 2024-07-11 DIAGNOSIS — J3489 Other specified disorders of nose and nasal sinuses: Secondary | ICD-10-CM | POA: Insufficient documentation

## 2024-07-11 DIAGNOSIS — I10 Essential (primary) hypertension: Secondary | ICD-10-CM | POA: Diagnosis not present

## 2024-07-11 DIAGNOSIS — J343 Hypertrophy of nasal turbinates: Secondary | ICD-10-CM | POA: Insufficient documentation

## 2024-07-11 DIAGNOSIS — Z87891 Personal history of nicotine dependence: Secondary | ICD-10-CM | POA: Insufficient documentation

## 2024-07-11 DIAGNOSIS — Z79899 Other long term (current) drug therapy: Secondary | ICD-10-CM | POA: Insufficient documentation

## 2024-07-11 DIAGNOSIS — E119 Type 2 diabetes mellitus without complications: Secondary | ICD-10-CM | POA: Diagnosis not present

## 2024-07-11 DIAGNOSIS — Z01818 Encounter for other preprocedural examination: Secondary | ICD-10-CM

## 2024-07-11 HISTORY — DX: Essential (primary) hypertension: I10

## 2024-07-11 HISTORY — DX: Prediabetes: R73.03

## 2024-07-11 HISTORY — PX: NASAL SEPTOPLASTY W/ TURBINOPLASTY: SHX2070

## 2024-07-11 LAB — GLUCOSE, CAPILLARY
Glucose-Capillary: 153 mg/dL — ABNORMAL HIGH (ref 70–99)
Glucose-Capillary: 133 mg/dL — ABNORMAL HIGH (ref 70–99)

## 2024-07-11 SURGERY — SEPTOPLASTY, NOSE, WITH NASAL TURBINATE REDUCTION
Anesthesia: General | Site: Nose | Laterality: Bilateral

## 2024-07-11 MED ORDER — LACTATED RINGERS IV SOLN: INTRAVENOUS | Status: DC

## 2024-07-11 MED ORDER — ROPIVACAINE HCL 5 MG/ML IJ SOLN: INTRAMUSCULAR | Status: DC | PRN

## 2024-07-11 MED ORDER — FENTANYL CITRATE (PF) 100 MCG/2ML IJ SOLN
INTRAMUSCULAR | Status: DC | PRN
  Administered 2024-07-11: 50 ug via INTRAVENOUS

## 2024-07-11 MED ORDER — LIDOCAINE HCL (CARDIAC) PF 100 MG/5ML IV SOSY
PREFILLED_SYRINGE | INTRAVENOUS | Status: DC | PRN
  Administered 2024-07-11: 60 mg via INTRAVENOUS

## 2024-07-11 MED ORDER — EPHEDRINE SULFATE (PRESSORS) 50 MG/ML IJ SOLN
INTRAMUSCULAR | Status: DC | PRN
  Administered 2024-07-11: 10 mg via INTRAVENOUS
  Administered 2024-07-11: 5 mg via INTRAVENOUS
  Administered 2024-07-11: 10 mg via INTRAVENOUS
  Administered 2024-07-11: 5 mg via INTRAVENOUS

## 2024-07-11 MED ORDER — EPHEDRINE 5 MG/ML INJ
INTRAVENOUS | Status: AC
  Filled 2024-07-11: qty 5

## 2024-07-11 MED ORDER — ACETAMINOPHEN 500 MG PO TABS
1000.0000 mg | ORAL_TABLET | Freq: Once | ORAL | Status: AC
  Administered 2024-07-11: 1000 mg via ORAL

## 2024-07-11 MED ORDER — DEXMEDETOMIDINE HCL IN NACL 80 MCG/20ML IV SOLN
INTRAVENOUS | Status: AC
  Filled 2024-07-11: qty 20

## 2024-07-11 MED ORDER — ONDANSETRON HCL 4 MG/2ML IJ SOLN
INTRAMUSCULAR | Status: DC | PRN
  Administered 2024-07-11: 4 mg via INTRAVENOUS

## 2024-07-11 MED ORDER — DEXAMETHASONE SODIUM PHOSPHATE 10 MG/ML IJ SOLN
INTRAMUSCULAR | Status: AC
  Filled 2024-07-11: qty 4

## 2024-07-11 MED ORDER — PROPOFOL 500 MG/50ML IV EMUL
INTRAVENOUS | Status: AC
  Filled 2024-07-11: qty 50

## 2024-07-11 MED ORDER — LIDOCAINE 2% (20 MG/ML) 5 ML SYRINGE
INTRAMUSCULAR | Status: AC
  Filled 2024-07-11: qty 10

## 2024-07-11 MED ORDER — FENTANYL CITRATE (PF) 100 MCG/2ML IJ SOLN
INTRAMUSCULAR | Status: AC
  Filled 2024-07-11: qty 2

## 2024-07-11 MED ORDER — ONDANSETRON HCL 4 MG/2ML IJ SOLN
INTRAMUSCULAR | Status: AC
  Filled 2024-07-11: qty 8

## 2024-07-11 MED ORDER — SUCCINYLCHOLINE CHLORIDE 200 MG/10ML IV SOSY
PREFILLED_SYRINGE | INTRAVENOUS | Status: AC
  Filled 2024-07-11: qty 10

## 2024-07-11 MED ORDER — PHENYLEPHRINE HCL (PRESSORS) 10 MG/ML IV SOLN
INTRAVENOUS | Status: DC | PRN
  Administered 2024-07-11 (×5): 80 ug via INTRAVENOUS

## 2024-07-11 MED ORDER — ROCURONIUM BROMIDE 100 MG/10ML IV SOLN
INTRAVENOUS | Status: DC | PRN
  Administered 2024-07-11: 60 mg via INTRAVENOUS

## 2024-07-11 MED ORDER — ATROPINE SULFATE 0.4 MG/ML IV SOLN
INTRAVENOUS | Status: AC
  Filled 2024-07-11: qty 1

## 2024-07-11 MED ORDER — SUGAMMADEX SODIUM 200 MG/2ML IV SOLN
INTRAVENOUS | Status: DC | PRN
  Administered 2024-07-11: 200 mg via INTRAVENOUS

## 2024-07-11 MED ORDER — PROPOFOL 10 MG/ML IV BOLUS
INTRAVENOUS | Status: DC | PRN
  Administered 2024-07-11: 150 mg via INTRAVENOUS

## 2024-07-11 MED ORDER — OXYMETAZOLINE HCL 0.05 % NA SOLN
NASAL | Status: DC | PRN
  Administered 2024-07-11: 1 via TOPICAL

## 2024-07-11 MED ORDER — LIDOCAINE-EPINEPHRINE 1 %-1:100000 IJ SOLN
INTRAMUSCULAR | Status: DC | PRN
  Administered 2024-07-11: 5.5 mL

## 2024-07-11 MED ORDER — PROPOFOL 10 MG/ML IV BOLUS
INTRAVENOUS | Status: AC
  Filled 2024-07-11: qty 20

## 2024-07-11 MED ORDER — DEXAMETHASONE SODIUM PHOSPHATE 10 MG/ML IJ SOLN
INTRAMUSCULAR | Status: DC | PRN
  Administered 2024-07-11: 8 mg via PERINEURAL

## 2024-07-11 MED ORDER — ACETAMINOPHEN 500 MG PO TABS
ORAL_TABLET | ORAL | Status: AC
Start: 2024-07-11 — End: 2024-07-11
  Filled 2024-07-11: qty 2

## 2024-07-11 MED ORDER — ONDANSETRON HCL 4 MG/2ML IJ SOLN: 4.0000 mg | Freq: Once | INTRAMUSCULAR | Status: DC | PRN

## 2024-07-11 MED ORDER — FENTANYL CITRATE (PF) 100 MCG/2ML IJ SOLN: 25.0000 ug | INTRAMUSCULAR | Status: DC | PRN

## 2024-07-11 MED ORDER — ROCURONIUM BROMIDE 10 MG/ML (PF) SYRINGE
PREFILLED_SYRINGE | INTRAVENOUS | Status: AC
  Filled 2024-07-11: qty 10

## 2024-07-11 MED ORDER — DEXAMETHASONE SODIUM PHOSPHATE 10 MG/ML IJ SOLN: INTRAMUSCULAR | Status: DC | PRN

## 2024-07-11 MED ORDER — MUPIROCIN 2 % EX OINT
TOPICAL_OINTMENT | CUTANEOUS | Status: DC | PRN
  Administered 2024-07-11: 1 via TOPICAL

## 2024-07-11 SURGICAL SUPPLY — 29 items
CANISTER SUCT 1200ML W/VALVE (MISCELLANEOUS) ×1 IMPLANT
COAGULATOR SUCT 8FR VV (MISCELLANEOUS) ×1 IMPLANT
DEFOGGER MIRROR 1QT (MISCELLANEOUS) ×1 IMPLANT
DRSG NASOPORE 8CM (GAUZE/BANDAGES/DRESSINGS) IMPLANT
DRSG TELFA 3X8 NADH STRL (GAUZE/BANDAGES/DRESSINGS) IMPLANT
ELECTRODE REM PT RTRN 9FT ADLT (ELECTROSURGICAL) ×1 IMPLANT
GAUZE SPONGE 2X2 STRL 8-PLY (GAUZE/BANDAGES/DRESSINGS) ×1 IMPLANT
GLOVE BIO SURGEON STRL SZ7.5 (GLOVE) ×1 IMPLANT
GLOVE BIOGEL PI IND STRL 7.0 (GLOVE) IMPLANT
GLOVE SURG SS PI 6.5 STRL IVOR (GLOVE) IMPLANT
GOWN STRL REUS W/ TWL LRG LVL3 (GOWN DISPOSABLE) ×2 IMPLANT
NDL HYPO 25X1 1.5 SAFETY (NEEDLE) ×1 IMPLANT
NEEDLE HYPO 25X1 1.5 SAFETY (NEEDLE) ×1 IMPLANT
NS IRRIG 1000ML POUR BTL (IV SOLUTION) ×1 IMPLANT
PACK BASIN DAY SURGERY FS (CUSTOM PROCEDURE TRAY) ×1 IMPLANT
PACK ENT DAY SURGERY (CUSTOM PROCEDURE TRAY) ×1 IMPLANT
SLEEVE SCD COMPRESS KNEE MED (STOCKING) IMPLANT
SPIKE FLUID TRANSFER (MISCELLANEOUS) IMPLANT
SPLINT NASAL AIRWAY SILICONE (MISCELLANEOUS) ×1 IMPLANT
SPONGE NEURO XRAY DETECT 1X3 (DISPOSABLE) ×1 IMPLANT
SUT CHROMIC 4 0 P 3 18 (SUTURE) ×1 IMPLANT
SUT CHROMIC 4 0 RB 1X27 (SUTURE) IMPLANT
SUT PLAIN 4 0 ~~LOC~~ 1 (SUTURE) ×1 IMPLANT
SUT PROLENE 3 0 PS 2 (SUTURE) ×1 IMPLANT
SUT VIC AB 4-0 P-3 18XBRD (SUTURE) IMPLANT
TOWEL GREEN STERILE FF (TOWEL DISPOSABLE) ×1 IMPLANT
TUBE SALEM SUMP 12FR 48 (TUBING) IMPLANT
TUBE SALEM SUMP 16F (TUBING) ×1 IMPLANT
YANKAUER SUCT BULB TIP NO VENT (SUCTIONS) ×1 IMPLANT

## 2024-07-11 NOTE — Anesthesia Procedure Notes (Signed)
 Procedure Name: Intubation Date/Time: 07/11/2024 10:01 AM  Performed by: Donnell Berwyn SQUIBB, CRNAPre-anesthesia Checklist: Patient identified, Emergency Drugs available, Suction available, Patient being monitored and Timeout performed Patient Re-evaluated:Patient Re-evaluated prior to induction Oxygen Delivery Method: Circle system utilized Preoxygenation: Pre-oxygenation with 100% oxygen Induction Type: IV induction Ventilation: Mask ventilation without difficulty Laryngoscope Size: Glidescope and 4 Grade View: Grade I Tube type: Oral Tube size: 7.0 mm Number of attempts: 1 Airway Equipment and Method: Stylet and Video-laryngoscopy Placement Confirmation: ETT inserted through vocal cords under direct vision, positive ETCO2 and breath sounds checked- equal and bilateral Secured at: 24 cm Tube secured with: Tape Dental Injury: Teeth and Oropharynx as per pre-operative assessment  Difficulty Due To: Difficulty was anticipated and Difficult Airway- due to dentition Comments: Large upper teeth

## 2024-07-11 NOTE — Op Note (Signed)
 DATE OF PROCEDURE: 07/11/2024  OPERATIVE REPORT   SURGEON: Daniel Moccasin, MD   PREOPERATIVE DIAGNOSES:  1. Severe nasal septal deviation.  2. Bilateral inferior turbinate hypertrophy.  3. Chronic nasal obstruction.  POSTOPERATIVE DIAGNOSES:  1. Severe nasal septal deviation.  2. Bilateral inferior turbinate hypertrophy.  3. Chronic nasal obstruction.  PROCEDURE PERFORMED:  1. Septoplasty.  2. Bilateral partial inferior turbinate resection.   ANESTHESIA: General endotracheal tube anesthesia.   COMPLICATIONS: None.   ESTIMATED BLOOD LOSS: 50 mL.   INDICATION FOR PROCEDURE: Maxwell Hinton is a 78 y.o. male with a history of chronic nasal obstruction. The patient was treated with antihistamine, decongestant, and steroid nasal sprays. However, the patient continued to be symptomatic. On examination, the patient was noted to have bilateral severe inferior turbinate hypertrophy and significant nasal septal deviation, causing significant nasal obstruction. Based on the above findings, the decision was made for the patient to undergo the above-stated procedures. The risks, benefits, alternatives, and details of the procedures were discussed with the patient. Questions were invited and answered. Informed consent was obtained.   DESCRIPTION OF PROCEDURE: The patient was taken to the operating room and placed supine on the operating table. General endotracheal tube anesthesia was administered by the anesthesiologist. The patient was positioned, and prepped and draped in the standard fashion for nasal surgery. Pledgets soaked with Afrin were placed in both nasal cavities for decongestion. The pledgets were subsequently removed.   Examination of the nasal cavity revealed a severe nasal septal deviation. 1% lidocaine  with 1:100,000 epinephrine  was injected onto the nasal septum bilaterally. A hemitransfixion incision was made on the left side. The mucosal flap was carefully elevated on the left side. A  cartilaginous incision was made 1 cm superior to the caudal margin of the nasal septum. Mucosal flap was also elevated on the right side in the similar fashion. It should be noted that due to the severe septal deviation, the deviated portion of the cartilaginous and bony septum had to be removed in piecemeal fashion. Once the deviated portions were removed, a straight midline septum was achieved. The septum was then quilted with 4-0 plain gut sutures. The hemitransfixion incision was closed with interrupted 4-0 chromic sutures.   The inferior one half of both hypertrophied inferior turbinate was crossclamped with a Kelly clamp. The inferior one half of each inferior turbinate was then resected with a pair of cross cutting scissors. Hemostasis was achieved with a suction cautery device. Doyle splints were applied to the nasal septum.  The care of the patient was turned over to the anesthesiologist. The patient was awakened from anesthesia without difficulty. The patient was extubated and transferred to the recovery room in good condition.   OPERATIVE FINDINGS: Severe nasal septal deviation and bilateral inferior turbinate hypertrophy.   SPECIMEN: None.   FOLLOWUP CARE: The patient be discharged home once he is awake and alert.  The patient will follow up in my office in 3 days for splint removal.   Maxwell Winterton Dois Moccasin, MD

## 2024-07-11 NOTE — Anesthesia Postprocedure Evaluation (Signed)
 Anesthesia Post Note  Patient: Maxwell Hinton  Procedure(s) Performed: SEPTOPLASTY WITH BILATERAL INFERIOR TURBINATE REDUCTION (Bilateral: Nose)     Patient location during evaluation: Phase II Anesthesia Type: General Level of consciousness: awake and alert, oriented and patient cooperative Pain management: pain level controlled Vital Signs Assessment: post-procedure vital signs reviewed and stable Respiratory status: spontaneous breathing, nonlabored ventilation and respiratory function stable Cardiovascular status: blood pressure returned to baseline and stable Postop Assessment: no apparent nausea or vomiting Anesthetic complications: no   No notable events documented.  Last Vitals:  Vitals:   07/11/24 1151 07/11/24 1203  BP:  134/64  Pulse: 69 62  Resp:  16  Temp:  36.6 C  SpO2: 92% 94%    Last Pain:  Vitals:   07/11/24 1203  PainSc: 0-No pain                 Almarie CHRISTELLA Marchi

## 2024-07-11 NOTE — Discharge Instructions (Addendum)
 Post Anesthesia Home Care Instructions  Activity: Get plenty of rest for the remainder of the day. A responsible individual must stay with you for 24 hours following the procedure.  For the next 24 hours, DO NOT: -Drive a car -Advertising copywriter -Drink alcoholic beverages -Take any medication unless instructed by your physician -Make any legal decisions or sign important papers.  Meals: Start with liquid foods such as gelatin or soup. Progress to regular foods as tolerated. Avoid greasy, spicy, heavy foods. If nausea and/or vomiting occur, drink only clear liquids until the nausea and/or vomiting subsides. Call your physician if vomiting continues.  Special Instructions/Symptoms: Your throat may feel dry or sore from the anesthesia or the breathing tube placed in your throat during surgery. If this causes discomfort, gargle with warm salt water . The discomfort should disappear within 24 hours.  If you had a scopolamine patch placed behind your ear for the management of post- operative nausea and/or vomiting:  1. The medication in the patch is effective for 72 hours, after which it should be removed.  Wrap patch in a tissue and discard in the trash. Wash hands thoroughly with soap and water . 2. You may remove the patch earlier than 72 hours if you experience unpleasant side effects which may include dry mouth, dizziness or visual disturbances. 3. Avoid touching the patch. Wash your hands with soap and water  after contact with the patch.     No tylenol  until after 230pm  -------------------  POSTOPERATIVE INSTRUCTIONS FOR PATIENTS HAVING NASAL OR SINUS OPERATIONS ACTIVITY: Restrict activity at home for the first two days, resting as much as possible. Light activity is best. You may usually return to work within a week. You should refrain from nose blowing, strenuous activity, or heavy lifting greater than 20lbs for a total of one week after your operation.  If sneezing cannot be avoided,  sneeze with your mouth open. DISCOMFORT: You may experience a dull headache and pressure along with nasal congestion and discharge. These symptoms may be worse during the first week after the operation but may last as long as two to four weeks.  Please take Tylenol  or the pain medication that has been prescribed for you. Do not take aspirin  or aspirin  containing medications since they may cause bleeding.  You may experience symptoms of post nasal drainage, nasal congestion, headaches and fatigue for two or three months after your operation.  BLEEDING: You may have some blood tinged nasal drainage for approximately two weeks after the operation.  The discharge will be worse for the first week.  Please call our office at [phone] or go to the nearest hospital emergency room if you experience any of the following: heavy, bright red blood from your nose or mouth that lasts longer than 15 minutes or coughing up or vomiting bright red blood or blood clots. GENERAL CONSIDERATIONS: A gauze dressing will be placed on your upper lip to absorb any drainage after the operation. You may need to change this several times a day.  If you do not have very much drainage, you may remove the dressing.  Remember that you may gently wipe your nose with a tissue and sniff in, but DO NOT blow your nose. Please keep all of your postoperative appointments.  Your final results after the operation will depend on proper follow-up.  The initial visit is usually 2 to 5 days after the operation.  During this visit, the remaining nasal packing and internal septal splints will be removed.  Your  nasal and sinus cavities will be cleaned.  During the second visit, your nasal and sinus cavities will be cleaned again. Have someone drive you to your first two postoperative appointments.  How you care for your nose after the operation will influence the results that you obtain.  You should follow all directions, take your medication as  prescribed, and call our office [phone] with any problems or questions. You may be more comfortable sleeping with your head elevated on two pillows. Do not take any medications that we have not prescribed or recommended. WARNING SIGNS: if any of the following should occur, please call our office: Persistent fever greater than 102F. Persistent vomiting. Severe and constant pain that is not relieved by prescribed pain medication. Trauma to the nose. Rash or unusual side effects from any medicines.

## 2024-07-11 NOTE — Anesthesia Procedure Notes (Deleted)
 Anesthesia Regional Block: Popliteal block   Pre-Anesthetic Checklist: , timeout performed,  Correct Patient, Correct Site, Correct Laterality,  Correct Procedure, Correct Position, site marked,  Risks and benefits discussed,  Surgical consent,  Pre-op evaluation,  At surgeon's request and post-op pain management  Laterality: Left  Prep: Maximum Sterile Barrier Precautions used, chloraprep       Needles:  Injection technique: Single-shot  Needle Type: Echogenic Stimulator Needle     Needle Length: 9cm  Needle Gauge: 22     Additional Needles:   Procedures:,,,, ultrasound used (permanent image in chart),,    Narrative:  Start time: 07/11/2024 9:15 AM End time: 07/11/2024 9:20 AM Injection made incrementally with aspirations every 5 mL.  Performed by: Personally  Anesthesiologist: Merla Almarie HERO, DO  Additional Notes: Monitors applied. No increased pain on injection. No increased resistance to injection. Injection made in 5cc increments. Good needle visualization. Patient tolerated procedure well.

## 2024-07-11 NOTE — H&P (Signed)
 CC: Chronic nasal obstruction   HPI:  Maxwell Hinton is a 78 y.o. male who presents today complaining of chronic nasal obstruction for 10+ years.  The severity of his nasal obstruction has increased over the past few years.  He has a history of nasal fractures twice.  The fractures were not treated.  It resulted in nasal dorsal deviation as well as nasal septal deviation.  He also has a history of environmental allergies.  He uses Claritin daily.  He has no previous nasal surgery.  Currently he denies any facial pain, fever, or visual change.       Past Medical History:  Diagnosis Date   GERD (gastroesophageal reflux disease)     Skin cancer                 Past Surgical History:  Procedure Laterality Date   colonoscopy with polypectomy        X2 ; hyperplastic   HIP RESURFACING Right 2010    hip   TONSILLECTOMY AND ADENOIDECTOMY        as a child   TOTAL KNEE ARTHROPLASTY Left 05/09/2022    Procedure: TOTAL KNEE ARTHROPLASTY;  Surgeon: Shari Sieving, MD;  Location: [facility];  Service: Orthopedics;  Laterality: Left;               Family History  Problem Relation Age of Onset   Diabetes Mother     Lung cancer Brother          2 ppd   Heart disease Neg Hx     Stroke Neg Hx     Colon cancer Neg Hx            Social History:  reports that he has been smoking cigars. He has never used smokeless tobacco. He reports current alcohol use of about 6.0 standard drinks of alcohol per week. He reports that he does not use drugs.   Allergies:  Allergies  No Known Allergies            Prior to Admission medications   Medication Sig Start Date End Date Taking? Authorizing Provider  acetaminophen  (TYLENOL ) 650 MG CR tablet Take 650 mg by mouth 3 (three) times daily.     Yes [provider]  Cholecalciferol (VITAMIN D3) 50 MCG (2000 UT) capsule Take 2,000 Units by mouth daily.     Yes [provider]  Cyanocobalamin  (B-12 PO) Take 5,000 Units by mouth daily.      Yes [provider]  loratadine (CLARITIN) 10 MG tablet Take 10 mg by mouth daily.     Yes [provider]  losartan  (COZAAR ) 50 MG tablet Take 1 tablet (50 mg total) by mouth daily. 05/30/24   Yes Norleen Lynwood ORN, MD  metFORMIN  (GLUCOPHAGE -XR) 500 MG 24 hr tablet Take 1 tablet (500 mg total) by mouth daily with breakfast. 05/30/24   Yes Norleen Lynwood ORN, MD      Blood pressure (!) 149/89, pulse 74, SpO2 94%. Exam: General: Communicates without difficulty, well nourished, no acute distress. Head: Normocephalic, no evidence injury, no tenderness, facial buttresses intact without stepoff. Face/sinus: No tenderness to palpation and percussion. Facial movement is normal and symmetric. Eyes: PERRL, EOMI. No scleral icterus, conjunctivae clear. Neuro: CN II exam reveals vision grossly intact.  No nystagmus at any point of gaze. Ears: Auricles well formed without lesions.  Ear canals are intact without mass or lesion.  No erythema or edema is appreciated.  The TMs are  intact without fluid. Nose: External evaluation reveals normal support and skin without lesions.  Dorsum is intact.  Anterior rhinoscopy reveals congested mucosa over anterior aspect of inferior turbinates and severely deviated septum.  No purulence noted. Oral:  Oral cavity and oropharynx are intact, symmetric, without erythema or edema.  Mucosa is moist without lesions. Neck: Full range of motion without pain.  There is no significant lymphadenopathy.  No masses palpable.  Thyroid  bed within normal limits to palpation.  Parotid glands and submandibular glands equal bilaterally without mass.  Trachea is midline. Neuro:  CN 2-12 grossly intact.    Procedure:  Flexible Nasal Endoscopy: Description: Risks, benefits, and alternatives of flexible endoscopy were explained to the patient.  Specific mention was made of the risk of throat numbness with difficulty swallowing, possible bleeding from the nose and mouth, and pain from the  procedure.  The patient gave oral consent to proceed.  The flexible scope was inserted into the right nasal cavity.  Endoscopy of the interior nasal cavity, superior, inferior, and middle meatus was performed. The sphenoid-ethmoid recess was examined. Edematous mucosa was noted.  No polyp, mass, or lesion was appreciated.  Severe nasal septal deviation noted. Olfactory cleft was clear.  Nasopharynx was clear.  Turbinates were hypertrophied but without mass.  The procedure was repeated on the contralateral side with similar findings.  The patient tolerated the procedure well.    Assessment: 1.  Chronic nasal obstruction, secondary to nasal mucosal congestion, severe nasal septal deviation, and bilateral inferior turbinate hypertrophy.  More than 95% of his nasal passageways are obstructed bilaterally. 2.  No suspicious mass or lesion is noted today.   Plan: 1.  The physical exam and nasal endoscopy findings are reviewed with the patient. 2.  Flonase nasal spray and Claritin daily. 3.  In light of his chronic and severe nasal obstruction, he will likely benefit from surgical intervention with septoplasty and bilateral turbinate reduction.  The risk, benefits, alternatives, and details of the procedures are extensively discussed.  Questions are invited and answered. 4.  The patient would like to proceed with the procedures.

## 2024-07-11 NOTE — Transfer of Care (Signed)
 Immediate Anesthesia Transfer of Care Note  Patient: Maxwell Hinton  Procedure(s) Performed: SEPTOPLASTY WITH BILATERAL INFERIOR TURBINATE REDUCTION (Bilateral: Nose)  Patient Location: PACU  Anesthesia Type:General  Level of Consciousness: awake, alert , and patient cooperative  Airway & Oxygen Therapy: Patient Spontanous Breathing and Patient connected to face mask oxygen  Post-op Assessment: Report given to RN and Post -op Vital signs reviewed and stable  Post vital signs: Reviewed and stable  Last Vitals:  Vitals Value Taken Time  BP 170/84 07/11/24 11:27  Temp 36.3 C 07/11/24 11:27  Pulse 76 07/11/24 11:27  Resp 12 07/11/24 11:27  SpO2 98 % 07/11/24 11:27    Last Pain:  Vitals:   07/11/24 0835  PainSc: 0-No pain         Complications: No notable events documented.

## 2024-07-12 ENCOUNTER — Encounter (HOSPITAL_BASED_OUTPATIENT_CLINIC_OR_DEPARTMENT_OTHER): Payer: Self-pay | Admitting: Otolaryngology

## 2024-07-14 ENCOUNTER — Ambulatory Visit (INDEPENDENT_AMBULATORY_CARE_PROVIDER_SITE_OTHER): Admitting: Otolaryngology

## 2024-07-14 ENCOUNTER — Encounter (INDEPENDENT_AMBULATORY_CARE_PROVIDER_SITE_OTHER): Payer: Self-pay | Admitting: Otolaryngology

## 2024-07-14 VITALS — BP 149/85 | HR 66 | Ht 79.0 in | Wt 276.0 lb

## 2024-07-14 DIAGNOSIS — J310 Chronic rhinitis: Secondary | ICD-10-CM

## 2024-07-14 DIAGNOSIS — Z9889 Other specified postprocedural states: Secondary | ICD-10-CM

## 2024-07-14 NOTE — Progress Notes (Signed)
 Doyle splints removed. Septum and turbinates are healing well.   Both Foss debrided.  Nasal saline irrigation.  Recheck in 3 weeks.

## 2024-07-15 ENCOUNTER — Ambulatory Visit (INDEPENDENT_AMBULATORY_CARE_PROVIDER_SITE_OTHER)

## 2024-07-15 VITALS — Ht 79.0 in | Wt 276.0 lb

## 2024-07-15 DIAGNOSIS — Z0000 Encounter for general adult medical examination without abnormal findings: Secondary | ICD-10-CM

## 2024-07-15 NOTE — Patient Instructions (Addendum)
 Mr. Frutiger,  Thank you for taking the time for your Medicare Wellness Visit. I appreciate your continued commitment to your health goals. Please review the care plan we discussed, and feel free to reach out if I can assist you further.  Medicare recommends these wellness visits once per year to help you and your care team stay ahead of potential health issues. These visits are designed to focus on prevention, allowing your provider to concentrate on managing your acute and chronic conditions during your regular appointments.  Please note that Annual Wellness Visits do not include a physical exam. Some assessments may be limited, especially if the visit was conducted virtually. If needed, we may recommend a separate in-person follow-up with your provider.  Ongoing Care Seeing your primary care provider every 3 to 6 months helps us  monitor your health and provide consistent, personalized care.   Referrals If a referral was made during today's visit and you haven't received any updates within two weeks, please contact the referred provider directly to check on the status.  Recommended Screenings:  Health Maintenance  Topic Date Due   DTaP/Tdap/Td vaccine (2 - Tdap) 08/23/2020   Flu Shot  05/13/2024   Medicare Annual Wellness Visit  07/15/2025   Pneumococcal Vaccine for age over 57  Completed   Hepatitis C Screening  Completed   Zoster (Shingles) Vaccine  Completed   HPV Vaccine  Aged Out   Meningitis B Vaccine  Aged Out   Colon Cancer Screening  Discontinued   COVID-19 Vaccine  Discontinued       07/15/2024    8:53 AM  Advanced Directives  Does Patient Have a Medical Advance Directive? Yes  Type of Estate agent of [place];Living will  Copy of Healthcare Power of Attorney in Chart? No - copy requested   Advance Care Planning is important because it: Ensures you receive medical care that aligns with your values, goals, and preferences. Provides guidance to  your family and loved ones, reducing the emotional burden of decision-making during critical moments.  Vision: Annual vision screenings are recommended for early detection of glaucoma, cataracts, and diabetic retinopathy. These exams can also reveal signs of chronic conditions such as diabetes and high blood pressure.  Dental: Annual dental screenings help detect early signs of oral cancer, gum disease, and other conditions linked to overall health, including heart disease and diabetes.

## 2024-07-15 NOTE — Progress Notes (Signed)
 Subjective:  Please attest and cosign this visit due to patients primary care provider not being in the office at the time the visit was completed.  (Pt of Dr Lynwood Rush)   Maxwell Hinton is a 78 y.o. who presents for a Medicare Wellness preventive visit.  As a reminder, Annual Wellness Visits don't include a physical exam, and some assessments may be limited, especially if this visit is performed virtually. We may recommend an in-person follow-up visit with your provider if needed.  Visit Complete: Virtual I connected with  Maxwell Hinton on 07/15/24 by a audio enabled telemedicine application and verified that I am speaking with the correct person using two identifiers.  Patient Location: Home  Provider Location: Office/Clinic  I discussed the limitations of evaluation and management by telemedicine. The patient expressed understanding and agreed to proceed.  Vital Signs: Because this visit was a virtual/telehealth visit, some criteria may be missing or patient reported. Any vitals not documented were not able to be obtained and vitals that have been documented are patient reported.  VideoDeclined- This patient declined Librarian, academic. Therefore the visit was completed with audio only.  Persons Participating in Visit: Patient.  AWV Questionnaire: No: Patient Medicare AWV questionnaire was not completed prior to this visit.  Cardiac Risk Factors include: advanced age (>58men, >21 women);dyslipidemia;hypertension;male gender;obesity (BMI >30kg/m2)     Objective:    Today's Vitals   07/15/24 0853  Weight: 276 lb (125.2 kg)  Height: 6' 7 (2.007 m)   Body mass index is 31.09 kg/m.     07/15/2024    8:53 AM 07/11/2024    8:32 AM 06/03/2023   11:06 AM 06/09/2022   10:04 AM 05/07/2022    1:12 PM 03/12/2016    2:23 PM  Advanced Directives  Does Patient Have a Medical Advance Directive? Yes Yes Yes Yes Yes Yes   Type of Special educational needs teacher of [place];Living will Living will Healthcare Power of [place];Living will Living will;Healthcare Power of [employer] Power of [place];Living will Healthcare Power of [place];Living will   Does patient want to make changes to medical advance directive?  No - Patient declined  No - Patient declined  No - Patient declined   Copy of Healthcare Power of Attorney in Chart? No - copy requested  No - copy requested No - copy requested  No - copy requested      Data saved with a previous flowsheet row definition    Current Medications (verified) Outpatient Encounter Medications as of 07/15/2024  Medication Sig   Cholecalciferol (VITAMIN D3) 50 MCG (2000 UT) capsule Take 2,000 Units by mouth daily.   Cyanocobalamin  (B-12 PO) Take 5,000 Units by mouth daily.   losartan  (COZAAR ) 100 MG tablet Take 1 tablet (100 mg total) by mouth daily.   metFORMIN  (GLUCOPHAGE -XR) 500 MG 24 hr tablet Take 1 tablet (500 mg total) by mouth daily with breakfast.   metoprolol  succinate (TOPROL -XL) 50 MG 24 hr tablet Take 1 tablet (50 mg total) by mouth daily. Take with or immediately following a meal.   No facility-administered encounter medications on file as of 07/15/2024.    Allergies (verified) Patient has no known allergies.   History: Past Medical History:  Diagnosis Date   GERD (gastroesophageal reflux disease)    Hypertension    Pre-diabetes    Skin cancer    Past Surgical History:  Procedure Laterality Date   colonoscopy with polypectomy     X2 ;  hyperplastic   HIP RESURFACING Right 2010   hip   NASAL SEPTOPLASTY W/ TURBINOPLASTY Bilateral 07/11/2024   Procedure: SEPTOPLASTY WITH BILATERAL INFERIOR TURBINATE REDUCTION;  Surgeon: Karis Clunes, MD;  Location: [facility];  Service: ENT;  Laterality: Bilateral;   TONSILLECTOMY AND ADENOIDECTOMY     as a child   TOTAL KNEE ARTHROPLASTY Left 05/09/2022   Procedure: TOTAL KNEE ARTHROPLASTY;  Surgeon: Shari Sieving,  MD;  Location: [facility];  Service: Orthopedics;  Laterality: Left;   Family History  Problem Relation Age of Onset   Diabetes Mother    Lung cancer Brother        2 ppd   Heart disease Neg Hx    Stroke Neg Hx    Colon cancer Neg Hx    Social History   Socioeconomic History   Marital status: Married    Spouse name: Not on file   Number of children: 2   Years of education: Not on file   Highest education level: Bachelor's degree (e.g., BA, AB, BS)  Occupational History   Not on file  Tobacco Use   Smoking status: Former    Types: Cigars    Quit date: 2015    Years since quitting: 10.7   Smokeless tobacco: Never  Vaping Use   Vaping status: Never Used  Substance and Sexual Activity   Alcohol use: Yes    Alcohol/week: 6.0 standard drinks of alcohol    Types: 2 Glasses of wine, 4 Standard drinks or equivalent per week   Drug use: No   Sexual activity: Yes  Other Topics Concern   Not on file  Social History Narrative   Married   Social Drivers of Health   Financial Resource Strain: Low Risk  (07/15/2024)   Overall Financial Resource Strain (CARDIA)    Difficulty of Paying Living Expenses: Not hard at all  Food Insecurity: No Food Insecurity (07/15/2024)   Hunger Vital Sign    Worried About Running Out of Food in the Last Year: Never true    Ran Out of Food in the Last Year: Never true  Transportation Needs: No Transportation Needs (07/15/2024)   PRAPARE - Administrator, Civil Service (Medical): No    Lack of Transportation (Non-Medical): No  Physical Activity: Inactive (07/15/2024)   Exercise Vital Sign    Days of Exercise per Week: 0 days    Minutes of Exercise per Session: 0 min  Stress: No Stress Concern Present (07/15/2024)   [employer] of Occupational Health - Occupational Stress Questionnaire    Feeling of Stress: Not at all  Social Connections: Moderately Isolated (07/15/2024)   Social Connection and Isolation Panel    Frequency of  Communication with Friends and Family: More than three times a week    Frequency of Social Gatherings with Friends and Family: Once a week    Attends Religious Services: Never    Database administrator or Organizations: No    Attends Engineer, structural: Never    Marital Status: Married    Tobacco Counseling Counseling given: Not Answered    Clinical Intake:  Pre-visit preparation completed: Yes  Pain : No/denies pain     BMI - recorded: 31.09 Nutritional Status: BMI > 30  Obese Nutritional Risks: None Diabetes: No  Lab Results  Component Value Date   HGBA1C 7.1 (H) 05/30/2024   HGBA1C 6.0 03/14/2022   HGBA1C 5.7 07/25/2020     How often do you need to  have someone help you when you read instructions, pamphlets, or other written materials from your doctor or pharmacy?: 1 - Never  Interpreter Needed?: No  Information entered by :: Verdie Saba, CMA   Activities of Daily Living     07/15/2024    8:57 AM 07/11/2024    8:13 AM  In your present state of health, do you have any difficulty performing the following activities:  Hearing? 0 0  Vision? 0 0  Difficulty concentrating or making decisions? 0 0  Walking or climbing stairs? 0   Dressing or bathing? 0   Doing errands, shopping? 0   Preparing Food and eating ? N   Using the Toilet? N   In the past six months, have you accidently leaked urine? N   Do you have problems with loss of bowel control? N   Managing your Medications? N   Managing your Finances? N   Housekeeping or managing your Housekeeping? N     Patient Care Team: Norleen Lynwood ORN, MD as PCP - General (Internal Medicine) [place], Heather, OD as Consulting Physician (Optometry)  I have updated your Care Teams any recent Medical Services you may have received from other providers in the past year.     Assessment:   This is a routine wellness examination for [place].  Hearing/Vision screen Hearing Screening - Comments:: Denies hearing  difficulties   Vision Screening - Comments:: Wears eyeglasses for reading - up to date with routine eye exams with Heather [place]   Goals Addressed               This Visit's Progress     Patient Stated (pt-stated)        Patient stated he plans to continue staying active       Depression Screen     07/15/2024    8:59 AM 06/20/2024   10:59 AM 05/30/2024   10:16 AM 06/03/2023   11:07 AM 04/01/2023    1:28 PM 06/09/2022   10:08 AM 03/13/2022    4:09 PM  PHQ 2/9 Scores  PHQ - 2 Score 0 0 0 0 0 0 0  PHQ- 9 Score 0   0       Fall Risk     07/15/2024    8:57 AM 06/20/2024   10:59 AM 05/30/2024   10:16 AM 06/03/2023   11:15 AM 06/02/2023    1:37 PM  Fall Risk   Falls in the past year? 0 0 0 0 0  Number falls in past yr: 0 0 0 0 0  Injury with Fall? 0 0 0 0 0  Risk for fall due to : No Fall Risks No Fall Risks No Fall Risks No Fall Risks   Follow up Falls evaluation completed;Falls prevention discussed Falls evaluation completed Falls evaluation completed Falls prevention discussed     MEDICARE RISK AT HOME:  Medicare Risk at Home Any stairs in or around the home?: Yes If so, are there any without handrails?: No Home free of loose throw rugs in walkways, pet beds, electrical cords, etc?: Yes Adequate lighting in your home to reduce risk of falls?: Yes Life alert?: No Use of a cane, walker or w/c?: No Grab bars in the bathroom?: Yes Shower chair or bench in shower?: No Elevated toilet seat or a handicapped toilet?: Yes  TIMED UP AND GO:  Was the test performed?  No  Cognitive Function: 6CIT completed        07/15/2024    9:01  AM 06/03/2023   11:16 AM 06/09/2022   10:21 AM  6CIT Screen  What Year? 0 points 0 points 0 points  What month? 0 points 0 points 0 points  What time? 0 points 0 points 0 points  Count back from 20 0 points 0 points 0 points  Months in reverse 0 points 0 points 0 points  Repeat phrase 0 points 0 points 0 points  Total Score 0 points 0 points 0  points    Immunizations Immunization History  Administered Date(s) Administered   Fluad Quad(high Dose 65+) 07/18/2019, 07/25/2020, 10/08/2022, 10/08/2022   H1N1 11/02/2008   INFLUENZA, HIGH DOSE SEASONAL PF 08/27/2017, 08/27/2018   Influenza Split 09/30/2011, 10/21/2012   Influenza,inj,Quad PF,6+ Mos 11/07/2014, 08/22/2015   PFIZER Comirnaty(Gray Top)Covid-19 Tri-Sucrose Vaccine 03/29/2021   PFIZER(Purple Top)SARS-COV-2 Vaccination 11/19/2019, 12/14/2019, 07/25/2020   Pfizer Covid-19 Vaccine Bivalent Booster 6yrs & up 11/25/2022   Pneumococcal Conjugate-13 10/02/2015   Pneumococcal Polysaccharide-23 05/19/2018   RSV,unspecified 10/09/2022   Td 08/23/2010   Zoster Recombinant(Shingrix) 11/09/2020, 01/23/2021   Zoster, Live 03/01/2013    Screening Tests Health Maintenance  Topic Date Due   DTaP/Tdap/Td (2 - Tdap) 08/23/2020   Influenza Vaccine  05/13/2024   Medicare Annual Wellness (AWV)  07/15/2025   Pneumococcal Vaccine: 50+ Years  Completed   Hepatitis C Screening  Completed   Zoster Vaccines- Shingrix  Completed   HPV VACCINES  Aged Out   Meningococcal B Vaccine  Aged Out   Colonoscopy  Discontinued   COVID-19 Vaccine  Discontinued    Health Maintenance Items Addressed:  07/15/2024  Additional Screening:  Vision Screening: Recommended annual ophthalmology exams for early detection of glaucoma and other disorders of the eye. Is the patient up to date with their annual eye exam?  Yes  Who is the provider or what is the name of the office in which the patient attends annual eye exams? [facility] [place]  Dental Screening: Recommended annual dental exams for proper oral hygiene  Community Resource Referral / Chronic Care Management: CRR required this visit?  No   CCM required this visit?  No   Plan:    I have personally reviewed and noted the following in the patient's chart:   Medical and social history Use of alcohol, tobacco or illicit drugs  Current  medications and supplements including opioid prescriptions. Patient is not currently taking opioid prescriptions. Functional ability and status Nutritional status Physical activity Advanced directives List of other physicians Hospitalizations, surgeries, and ER visits in previous 12 months Vitals Screenings to include cognitive, depression, and falls Referrals and appointments  In addition, I have reviewed and discussed with patient certain preventive protocols, quality metrics, and best practice recommendations. A written personalized care plan for preventive services as well as general preventive health recommendations were provided to patient.   Verdie CHRISTELLA Saba, CMA   07/15/2024   After Visit Summary: (MyChart) Due to this being a telephonic visit, the after visit summary with patients personalized plan was offered to patient via MyChart   Notes: Due to traveling, pt will call later to schedule a 6-mth f/u w/PCP.

## 2024-08-03 ENCOUNTER — Ambulatory Visit (INDEPENDENT_AMBULATORY_CARE_PROVIDER_SITE_OTHER): Admitting: Otolaryngology

## 2024-08-03 ENCOUNTER — Encounter (INDEPENDENT_AMBULATORY_CARE_PROVIDER_SITE_OTHER): Payer: Self-pay | Admitting: Otolaryngology

## 2024-08-03 VITALS — BP 155/82 | HR 60 | Temp 97.8°F

## 2024-08-03 DIAGNOSIS — J310 Chronic rhinitis: Secondary | ICD-10-CM

## 2024-08-03 NOTE — Progress Notes (Signed)
 Septum and turbinates are healing well.   Both [place] debrided.   Nasal saline irrigation as needed.   Recheck in 6 months.

## 2024-09-22 ENCOUNTER — Encounter: Payer: Self-pay | Admitting: Internal Medicine

## 2024-09-23 MED ORDER — LOSARTAN POTASSIUM 100 MG PO TABS: 100.0000 mg | ORAL_TABLET | Freq: Every day | ORAL | 3 refills | Status: AC

## 2024-09-23 NOTE — Addendum Note (Signed)
 Addended by: NORLEEN LYNWOOD ORN on: 09/23/2024 10:04 AM   Modules accepted: Orders

## 2024-10-28 ENCOUNTER — Encounter: Payer: Self-pay | Admitting: *Deleted

## 2024-10-28 NOTE — Progress Notes (Signed)
 Torion Hulgan                                          MRN: [id number]   10/28/2024   The [facility]BCI Quality Team Specialist reviewed this patient medical record for the purposes of chart review for care gap closure. The following were reviewed: chart review for care gap closure-kidney health evaluation for diabetes:eGFR  and uACR.    VBCI Quality Team

## 2025-02-03 ENCOUNTER — Ambulatory Visit (INDEPENDENT_AMBULATORY_CARE_PROVIDER_SITE_OTHER): Admitting: Otolaryngology

## 2025-07-20 ENCOUNTER — Ambulatory Visit
